# Patient Record
Sex: Female | Born: 1955 | Race: White | Hispanic: No | Marital: Single
Health system: Southern US, Academic
[De-identification: ages and names within clinical notes are randomized; demographics above are authoritative.]

## PROBLEM LIST (undated history)

## (undated) ENCOUNTER — Encounter: Attending: Hematology & Oncology | Primary: Hematology & Oncology

## (undated) ENCOUNTER — Ambulatory Visit: Payer: MEDICARE

## (undated) ENCOUNTER — Encounter: Attending: Adult Health | Primary: Adult Health

## (undated) ENCOUNTER — Encounter

## (undated) ENCOUNTER — Ambulatory Visit: Payer: MEDICARE | Attending: Hematology & Oncology | Primary: Hematology & Oncology

## (undated) ENCOUNTER — Telehealth: Attending: Hematology & Oncology | Primary: Hematology & Oncology

## (undated) ENCOUNTER — Ambulatory Visit
Payer: MEDICARE | Attending: Rehabilitative and Restorative Service Providers" | Primary: Rehabilitative and Restorative Service Providers"

## (undated) ENCOUNTER — Telehealth

## (undated) ENCOUNTER — Encounter: Attending: Oncology | Primary: Oncology

## (undated) ENCOUNTER — Ambulatory Visit: Payer: MEDICARE | Attending: Radiation Oncology | Primary: Radiation Oncology

## (undated) ENCOUNTER — Ambulatory Visit: Attending: Oncology | Primary: Oncology

## (undated) ENCOUNTER — Ambulatory Visit

## (undated) ENCOUNTER — Telehealth: Attending: Oncology | Primary: Oncology

## (undated) ENCOUNTER — Ambulatory Visit: Attending: Adult Health | Primary: Adult Health

## (undated) DIAGNOSIS — F32A Depression, unspecified: Secondary | ICD-10-CM

## (undated) DIAGNOSIS — F329 Major depressive disorder, single episode, unspecified: Secondary | ICD-10-CM

## (undated) DIAGNOSIS — I10 Essential (primary) hypertension: Secondary | ICD-10-CM

## (undated) HISTORY — DX: Essential (primary) hypertension: I10

## (undated) HISTORY — DX: Depression, unspecified: F32.A

## (undated) HISTORY — DX: Major depressive disorder, single episode, unspecified: F32.9

---
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
## 2001-04-21 HISTORY — PX: SHOULDER SURGERY: SHX246

## 2004-02-07 ENCOUNTER — Ambulatory Visit: Payer: Self-pay | Admitting: Internal Medicine

## 2006-02-22 ENCOUNTER — Emergency Department: Payer: Self-pay | Admitting: Emergency Medicine

## 2006-03-03 ENCOUNTER — Emergency Department: Payer: Self-pay | Admitting: Emergency Medicine

## 2010-04-02 ENCOUNTER — Ambulatory Visit: Payer: Self-pay

## 2011-12-12 IMAGING — US US RENAL KIDNEY
1 series · 14 of 25 positions shown · non-contrast
Comparison: none

REASON FOR EXAM: hypertension
COMMENTS:

[Series 1: us renal kidney · 0.24mm/px · 14 of 27 slices shown]
[im 1/27]
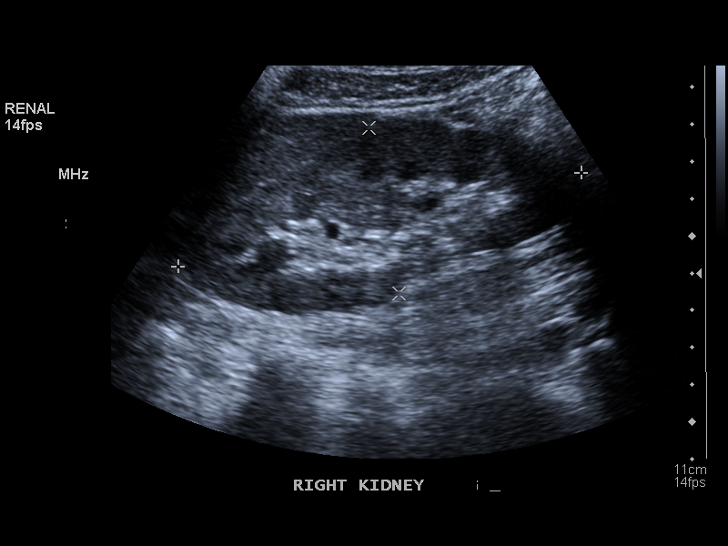
[im 3/27]
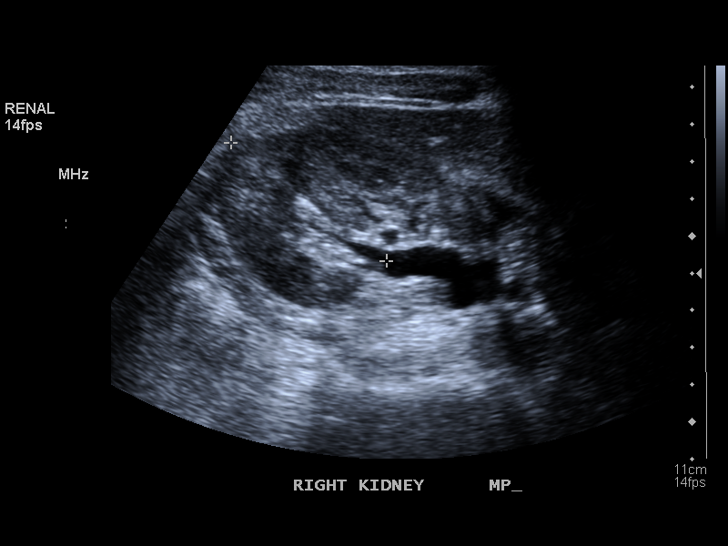
[im 5/27]
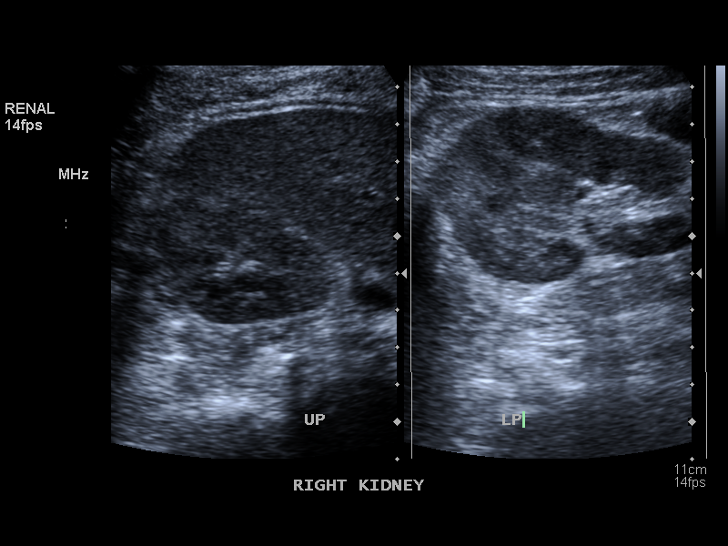
[im 7/27]
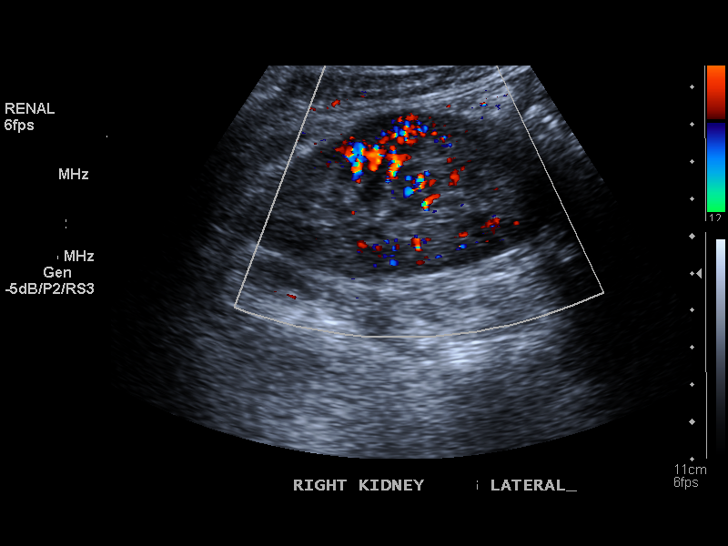
[im 9/27]
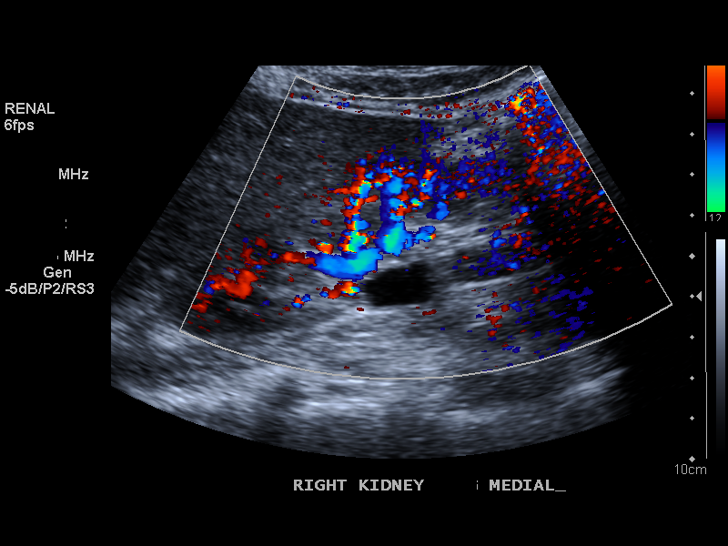
[im 10/27]
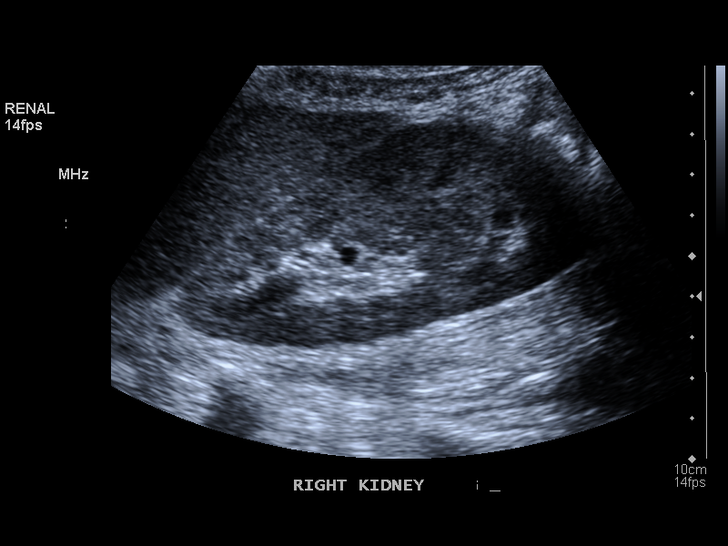
[im 12/27]
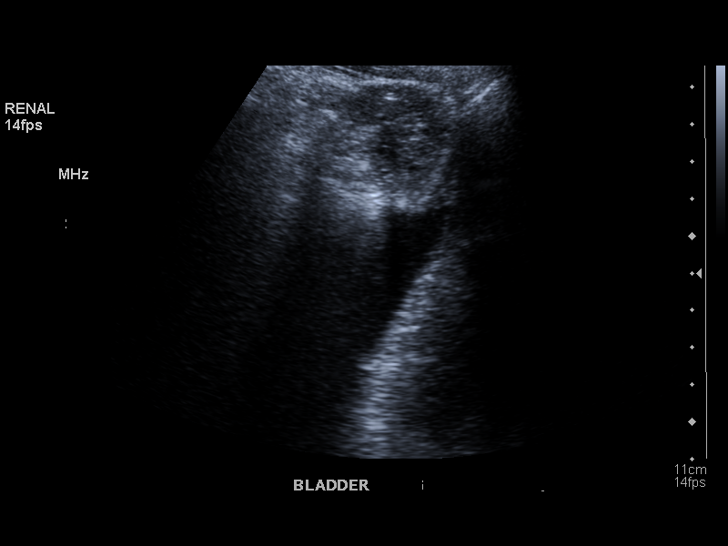
[im 15/27]
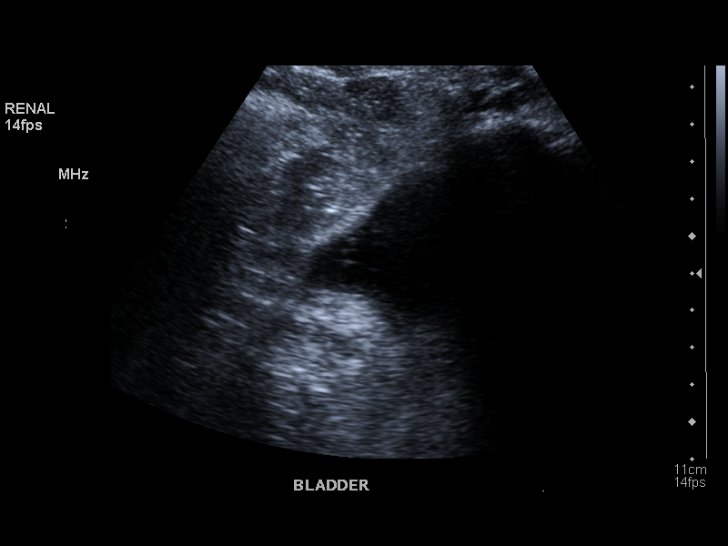
[im 17/27]
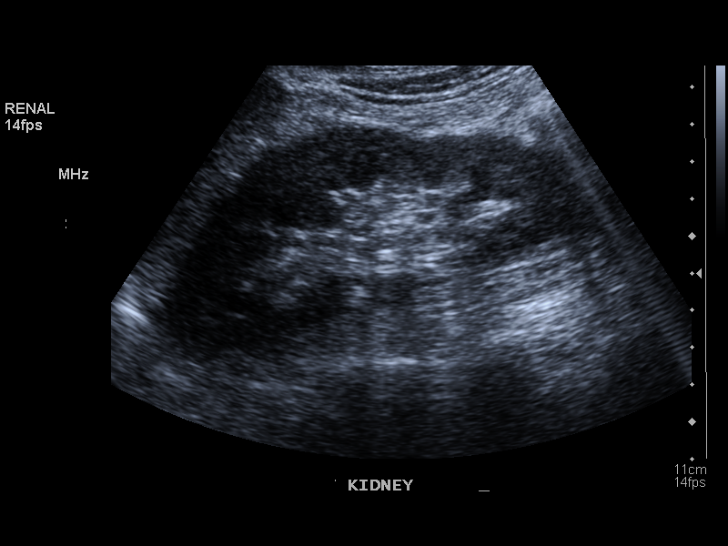
[im 18/27]
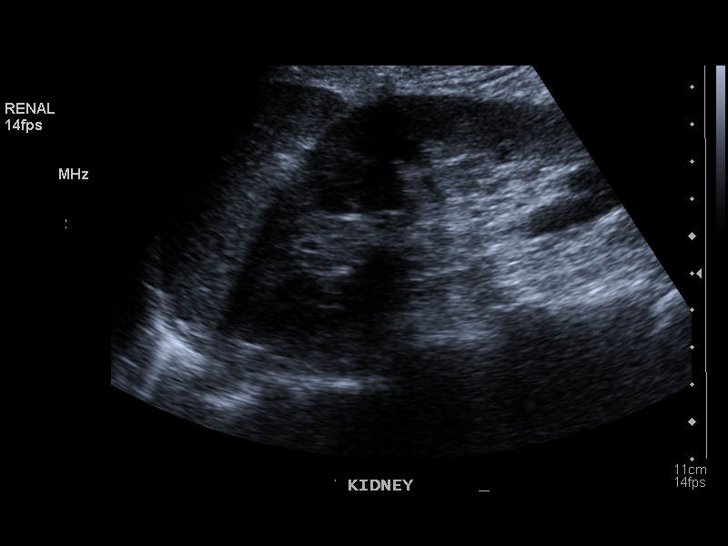
[im 20/27]
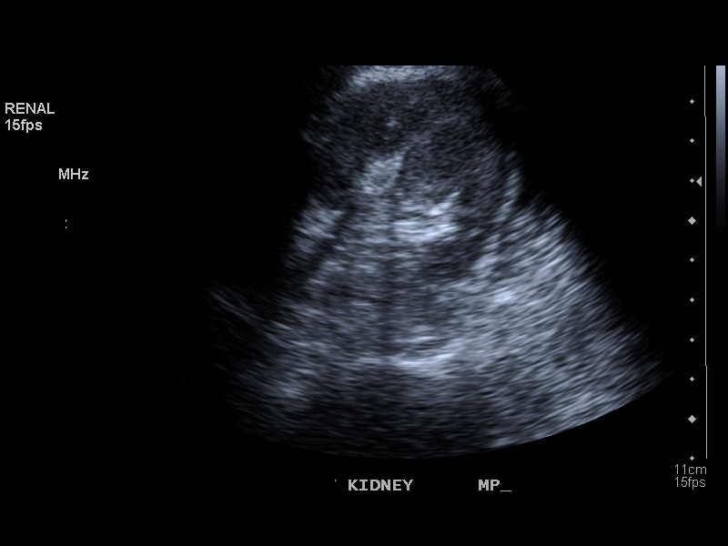
[im 22/27]
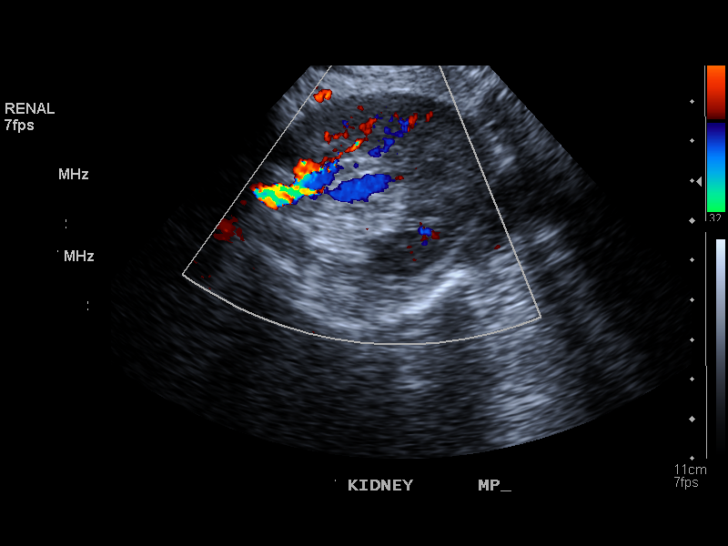
[im 24/27]
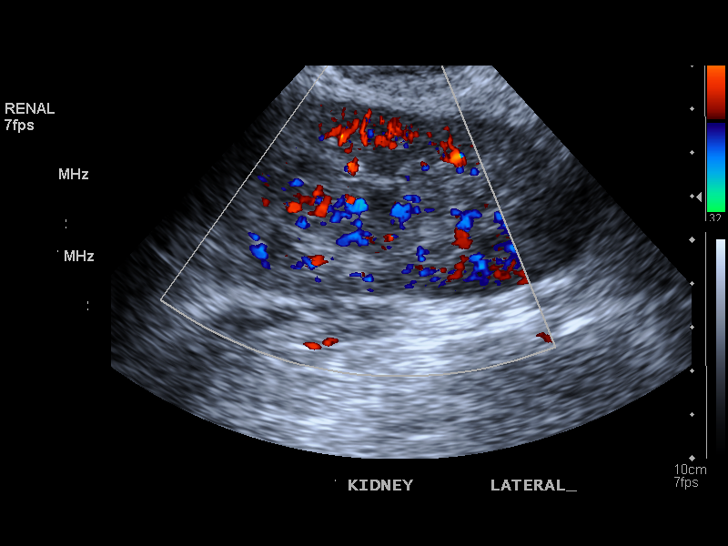
[im 27/27]
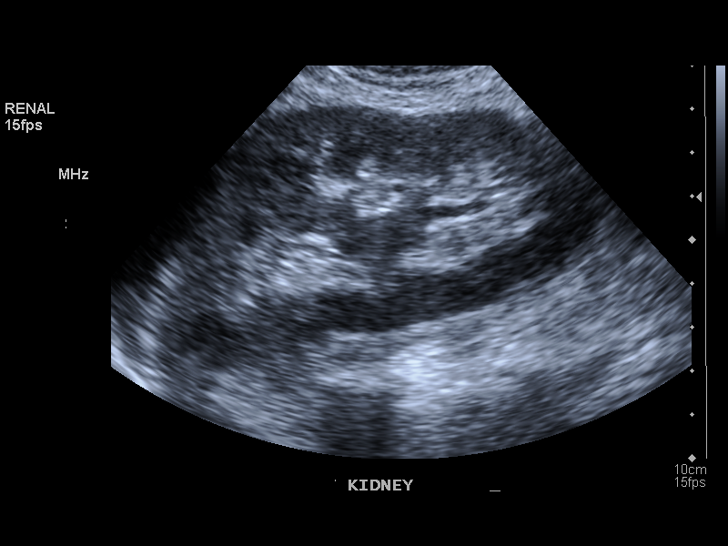

[14 of 25 positions shown; findings below may reference images not displayed]

PROCEDURE:     US  - US KIDNEY  - April 02, 2010  [DATE]

RESULT:     The right kidney measures 11.13 cm x 4.53 cm x 5.26 cm and the
left kidney measures 11.83 cm x 5.06 cm x 4.37 cm. No renal mass lesions are
seen. No renal calcifications are observed. There is no hydronephrosis.
Renal cortical margins bilaterally are smooth. The urinary bladder is nearly
empty since the patient voided prior to the exam. No free fluid is seen in
the pelvis.
IMPRESSION: 1. The kidneys are of symmetrical size.
2. No renal mass lesions, calcifications or hydronephrosis is seen.

## 2013-06-20 ENCOUNTER — Ambulatory Visit: Payer: Self-pay | Admitting: Family Medicine

## 2014-09-25 ENCOUNTER — Other Ambulatory Visit: Payer: Self-pay | Admitting: Family Medicine

## 2014-09-25 MED ORDER — METOPROLOL SUCCINATE ER 100 MG PO TB24: 100.0000 mg | ORAL_TABLET | Freq: Every day | ORAL | Status: AC

## 2014-10-31 ENCOUNTER — Other Ambulatory Visit: Payer: Self-pay | Admitting: Family Medicine

## 2014-11-30 ENCOUNTER — Other Ambulatory Visit: Payer: Self-pay | Admitting: *Deleted

## 2014-11-30 DIAGNOSIS — R000 Tachycardia, unspecified: Secondary | ICD-10-CM | POA: Insufficient documentation

## 2014-11-30 DIAGNOSIS — F329 Major depressive disorder, single episode, unspecified: Secondary | ICD-10-CM | POA: Insufficient documentation

## 2014-11-30 DIAGNOSIS — L659 Nonscarring hair loss, unspecified: Secondary | ICD-10-CM | POA: Insufficient documentation

## 2014-11-30 DIAGNOSIS — Z87898 Personal history of other specified conditions: Secondary | ICD-10-CM | POA: Insufficient documentation

## 2014-11-30 DIAGNOSIS — J45909 Unspecified asthma, uncomplicated: Secondary | ICD-10-CM | POA: Insufficient documentation

## 2014-11-30 DIAGNOSIS — M67449 Ganglion, unspecified hand: Secondary | ICD-10-CM | POA: Insufficient documentation

## 2014-11-30 DIAGNOSIS — R634 Abnormal weight loss: Secondary | ICD-10-CM | POA: Insufficient documentation

## 2014-11-30 DIAGNOSIS — J019 Acute sinusitis, unspecified: Secondary | ICD-10-CM | POA: Insufficient documentation

## 2014-11-30 DIAGNOSIS — R0989 Other specified symptoms and signs involving the circulatory and respiratory systems: Secondary | ICD-10-CM | POA: Insufficient documentation

## 2014-11-30 DIAGNOSIS — F32A Depression, unspecified: Secondary | ICD-10-CM | POA: Insufficient documentation

## 2014-11-30 DIAGNOSIS — Z23 Encounter for immunization: Secondary | ICD-10-CM | POA: Insufficient documentation

## 2014-11-30 DIAGNOSIS — Z719 Counseling, unspecified: Secondary | ICD-10-CM | POA: Insufficient documentation

## 2014-11-30 DIAGNOSIS — Z789 Other specified health status: Secondary | ICD-10-CM | POA: Insufficient documentation

## 2014-11-30 DIAGNOSIS — I10 Essential (primary) hypertension: Secondary | ICD-10-CM | POA: Insufficient documentation

## 2014-11-30 DIAGNOSIS — F17200 Nicotine dependence, unspecified, uncomplicated: Secondary | ICD-10-CM | POA: Insufficient documentation

## 2014-12-01 ENCOUNTER — Ambulatory Visit (INDEPENDENT_AMBULATORY_CARE_PROVIDER_SITE_OTHER): Payer: BLUE CROSS/BLUE SHIELD | Admitting: Family Medicine

## 2014-12-01 ENCOUNTER — Encounter: Payer: Self-pay | Admitting: Family Medicine

## 2014-12-01 VITALS — BP 100/50 | HR 62 | Temp 97.9°F | Resp 16 | Ht 63.0 in | Wt 89.4 lb

## 2014-12-01 DIAGNOSIS — I10 Essential (primary) hypertension: Secondary | ICD-10-CM | POA: Diagnosis not present

## 2014-12-01 DIAGNOSIS — M1990 Unspecified osteoarthritis, unspecified site: Secondary | ICD-10-CM | POA: Diagnosis not present

## 2014-12-01 DIAGNOSIS — R634 Abnormal weight loss: Secondary | ICD-10-CM | POA: Diagnosis not present

## 2014-12-01 DIAGNOSIS — R0989 Other specified symptoms and signs involving the circulatory and respiratory systems: Secondary | ICD-10-CM | POA: Insufficient documentation

## 2014-12-01 DIAGNOSIS — R000 Tachycardia, unspecified: Secondary | ICD-10-CM | POA: Diagnosis not present

## 2014-12-01 MED ORDER — AMLODIPINE BESYLATE 5 MG PO TABS: 5.0000 mg | ORAL_TABLET | Freq: Every day | ORAL | Status: AC

## 2014-12-01 MED ORDER — MELOXICAM 7.5 MG PO TABS: 7.5000 mg | ORAL_TABLET | Freq: Every day | ORAL | Status: AC

## 2014-12-01 NOTE — Patient Instructions (Addendum)
Discussed supplementing diet with [employer] Breakfast 2-3 times a day. Take 1/2 of 10 mg. Amlodipine daily until bottle empty. Cont. Other meds

## 2014-12-01 NOTE — Progress Notes (Signed)
Name: Veronica Fowler   MRN: [id number]    DOB: 19-Dec-1955   Date:12/01/2014       Progress Note  Subjective  Chief Complaint  Chief Complaint  Patient presents with  . Follow-up    pt complains rt hand has been hurting x 6 mos.   . Hand Pain  . Hypertension    HPI  Here for f/u of HBP and underweight.  C/o Pain in R. Hand (base of thumb).  Having problems eating enough calories.  Losing weight again. No problem-specific assessment & plan notes found for this encounter.   Past Medical History  Diagnosis Date  . Depression   . Hypertension     Social History  Substance Use Topics  . Smoking status: Current Every Day Smoker -- 1.00 packs/day for 42 years    Types: Cigarettes  . Smokeless tobacco: Never Used  . Alcohol Use: 0.0 oz/week    0 Standard drinks or equivalent per week     Current outpatient prescriptions:  .  losartan (COZAAR) 25 MG tablet, take 1 tablet by mouth once daily, Disp: 30 tablet, Rfl: 1 .  metoprolol succinate (TOPROL-XL) 100 MG 24 hr tablet, Take 1 tablet (100 mg total) by mouth daily. Take with or immediately following a meal., Disp: 90 tablet, Rfl: 3 .  Prenatal Vit-Fe Fumarate-FA (PRENATAL VITAMINS) 28-0.8 MG TABS, Take 1 tablet by mouth daily., Disp: , Rfl:  .  amLODipine (NORVASC) 5 MG tablet, Take 1 tablet (5 mg total) by mouth daily., Disp: 90 tablet, Rfl: 3 .  meloxicam (MOBIC) 7.5 MG tablet, Take 1 tablet (7.5 mg total) by mouth daily. Take with food., Disp: 30 tablet, Rfl: 3  Allergies  Allergen Reactions  . Penicillins Hives    Other reaction(s): Itching    Review of Systems  Constitutional: Positive for weight loss. Negative for fever, chills and malaise/fatigue.  HENT: Negative for hearing loss.   Eyes: Negative for blurred vision and double vision.  Respiratory: Negative for cough, sputum production, shortness of breath and wheezing.   Cardiovascular: Negative for chest pain, palpitations, orthopnea and leg swelling.   Gastrointestinal: Negative for heartburn, nausea, vomiting, abdominal pain, diarrhea and blood in stool.  Genitourinary: Negative for dysuria, urgency and frequency.  Musculoskeletal: Negative for joint pain (R. thumb base).  Skin: Negative for rash.  Neurological: Negative for dizziness, tingling, sensory change, focal weakness, weakness and headaches.  Psychiatric/Behavioral: Negative for depression. The patient is not nervous/anxious.       Objective  Filed Vitals:   12/01/14 0912 12/01/14 0956  BP: 106/59 100/50  Pulse: 62   Temp: 97.9 F (36.6 C)   TempSrc: Oral   Resp: 16   Height: 5\' 3"  (1.6 m)   Weight: 89 lb 6.4 oz (40.552 kg)      Physical Exam  Constitutional: She is oriented to person, place, and time. No distress.  cachetic  HENT:  Head: Normocephalic and atraumatic.  Eyes: Conjunctivae and EOM are normal. Pupils are equal, round, and reactive to light. No scleral icterus.  Neck: Normal range of motion. Neck supple. Carotid bruit is not present. No thyromegaly present.  Cardiovascular: Normal rate, regular rhythm, normal heart sounds and intact distal pulses.  Exam reveals no gallop and no friction rub.   No murmur heard. Pulmonary/Chest: Effort normal and breath sounds normal. No respiratory distress. She has no wheezes. She has no rales.  Abdominal: Soft. Bowel sounds are normal. She exhibits abdominal bruit. She exhibits no distension and  no mass. There is no tenderness.  Musculoskeletal: Normal range of motion. She exhibits no edema.  Lymphadenopathy:    She has no cervical adenopathy.  Neurological: She is alert and oriented to person, place, and time. No cranial nerve deficit. Coordination normal.  Skin: Skin is warm and dry.  Vitals reviewed.     No results found for this or any previous visit (from the past 2160 hour(s)).   Assessment & Plan  1. Essential hypertension  - amLODipine (NORVASC) 5 MG tablet; Take 1 tablet (5 mg total) by mouth  daily.  Dispense: 90 tablet; Refill: 3-decreased from 10 mg/d. 2. Decreased body weight  - Comprehensive Metabolic Panel (CMET) - CBC with Differential - TSH  3. Fast heart beat   4. Arthritis  - meloxicam (MOBIC) 7.5 MG tablet; Take 1 tablet (7.5 mg total) by mouth daily. Take with food.  Dispense: 30 tablet; Refill: 3  5. Abdominal bruit  - Lipid Profile

## 2014-12-07 LAB — CBC WITH DIFFERENTIAL/PLATELET
Basophils Absolute: 0.1 x10E3/uL (ref 0.0–0.2)
Basos: 1 %
EOS (ABSOLUTE): 0.3 x10E3/uL (ref 0.0–0.4)
Eos: 3 %
Hematocrit: 39.9 % (ref 34.0–46.6)
Hemoglobin: 13.8 g/dL (ref 11.1–15.9)
Immature Grans (Abs): 0 x10E3/uL (ref 0.0–0.1)
Immature Granulocytes: 0 %
Lymphocytes Absolute: 2.8 x10E3/uL (ref 0.7–3.1)
Lymphs: 32 %
MCH: 33.7 pg — ABNORMAL HIGH (ref 26.6–33.0)
MCHC: 34.6 g/dL (ref 31.5–35.7)
MCV: 98 fL — ABNORMAL HIGH (ref 79–97)
Monocytes Absolute: 1.2 x10E3/uL — ABNORMAL HIGH (ref 0.1–0.9)
Monocytes: 13 %
Neutrophils Absolute: 4.5 x10E3/uL (ref 1.4–7.0)
Neutrophils: 51 %
Platelets: 310 x10E3/uL (ref 150–379)
RBC: 4.09 x10E6/uL (ref 3.77–5.28)
RDW: 12 % — ABNORMAL LOW (ref 12.3–15.4)
WBC: 8.8 x10E3/uL (ref 3.4–10.8)

## 2014-12-08 ENCOUNTER — Telehealth: Payer: Self-pay | Admitting: Family Medicine

## 2014-12-08 LAB — COMPREHENSIVE METABOLIC PANEL
ALT: 7 IU/L (ref 0–32)
AST: 19 IU/L (ref 0–40)
Albumin/Globulin Ratio: 1.9 (ref 1.1–2.5)
Albumin: 4.4 g/dL (ref 3.6–4.8)
Alkaline Phosphatase: 81 IU/L (ref 39–117)
BUN/Creatinine Ratio: 31 — ABNORMAL HIGH (ref 11–26)
BUN: 19 mg/dL (ref 8–27)
Bilirubin Total: 0.3 mg/dL (ref 0.0–1.2)
CALCIUM: 9.3 mg/dL (ref 8.7–10.3)
CO2: 21 mmol/L (ref 18–29)
CREATININE: 0.62 mg/dL (ref 0.57–1.00)
Chloride: 100 mmol/L (ref 97–108)
GFR calc Af Amer: 113 mL/min/{1.73_m2} (ref >59)
GFR, EST NON AFRICAN AMERICAN: 98 mL/min/{1.73_m2} (ref >59)
Globulin, Total: 2.3 g/dL (ref 1.5–4.5)
Glucose: 80 mg/dL (ref 65–99)
Potassium: 4.8 mmol/L (ref 3.5–5.2)
Sodium: 139 mmol/L (ref 134–144)
Total Protein: 6.7 g/dL (ref 6.0–8.5)

## 2014-12-08 LAB — LIPID PANEL
CHOL/HDL RATIO: 2.3 ratio (ref 0.0–4.4)
CHOLESTEROL TOTAL: 167 mg/dL (ref 100–199)
HDL: 73 mg/dL (ref >39)
LDL Calculated: 81 mg/dL (ref 0–99)
Triglycerides: 65 mg/dL (ref 0–149)
VLDL Cholesterol Cal: 13 mg/dL (ref 5–40)

## 2014-12-08 LAB — TSH: TSH: 1.18 u[IU]/mL (ref 0.450–4.500)

## 2014-12-08 NOTE — Telephone Encounter (Signed)
Called [facility] and added on B12 and folate due to macrocytosis on CBC under Dx code d75.89.

## 2014-12-09 LAB — B12 AND FOLATE PANEL: Vitamin B-12: 619 pg/mL (ref 211–946)

## 2014-12-12 NOTE — Progress Notes (Signed)
Mailed.JH  

## 2014-12-13 LAB — SPECIMEN STATUS REPORT

## 2014-12-13 NOTE — Progress Notes (Signed)
Mailed

## 2015-01-13 ENCOUNTER — Other Ambulatory Visit: Payer: Self-pay | Admitting: Family Medicine

## 2015-02-16 ENCOUNTER — Other Ambulatory Visit: Payer: Self-pay | Admitting: Family Medicine

## 2015-03-01 IMAGING — US ULTRASOUND RETROPERITONEAL COMPLETE
1 series · 14 of 25 positions shown · non-contrast
Comparison: None.

CLINICAL DATA: Abdominal bruit

EXAM:
RETROPERITONEAL ULTRASOUND COMPLETE
TECHNIQUE: Ultrasound examination of the abdominal aorta was performed to
evaluate for abdominal aortic aneurysm. The common iliac arteries,
IVC, and kidneys were also evaluated.

[Series 1: ultrasound retroperitoneal complete · 0.16mm/px · 14 of 53 slices shown]
[im 1/53]
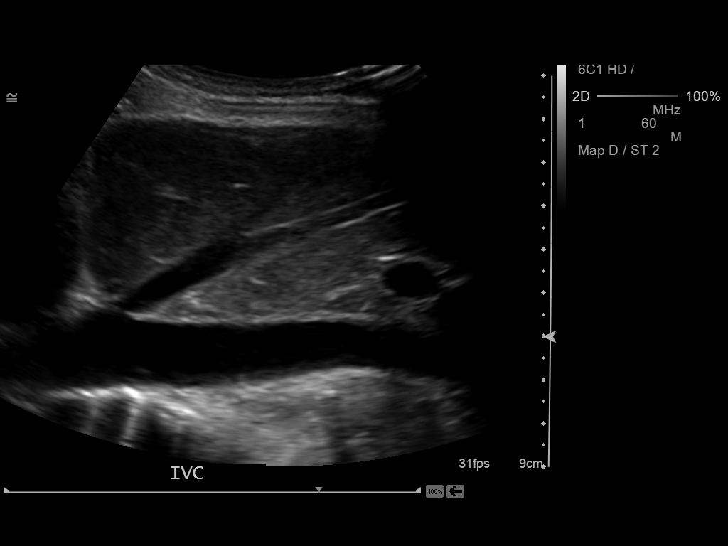
[im 5/53]
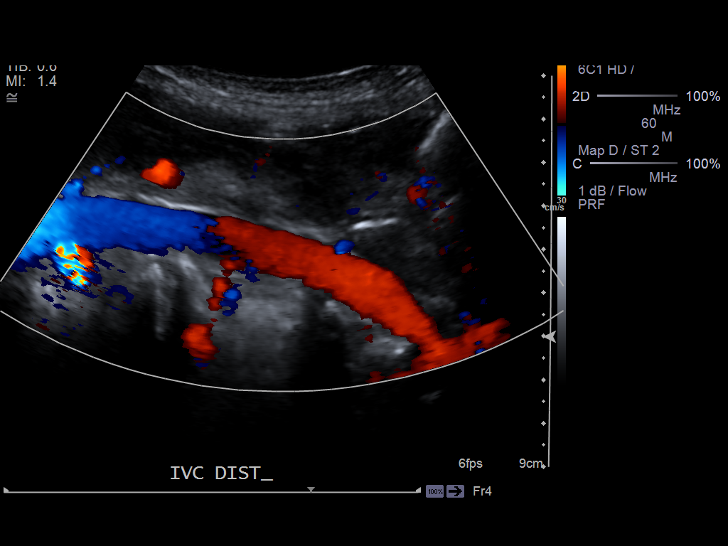
[im 9/53]
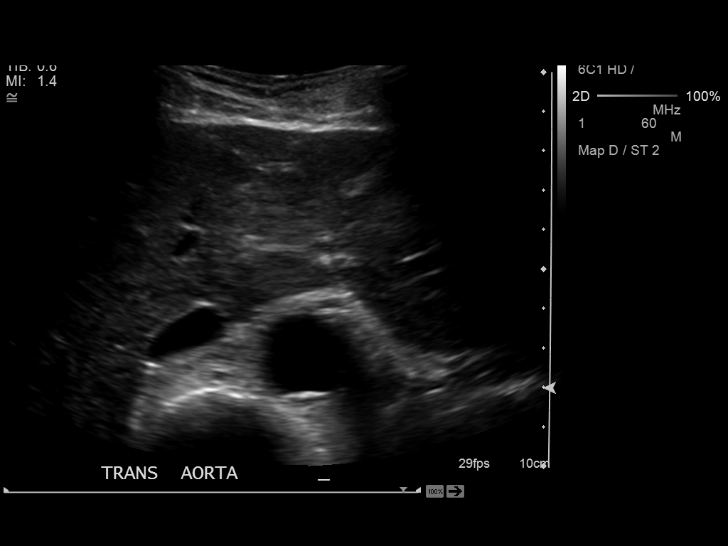
[im 14/53]
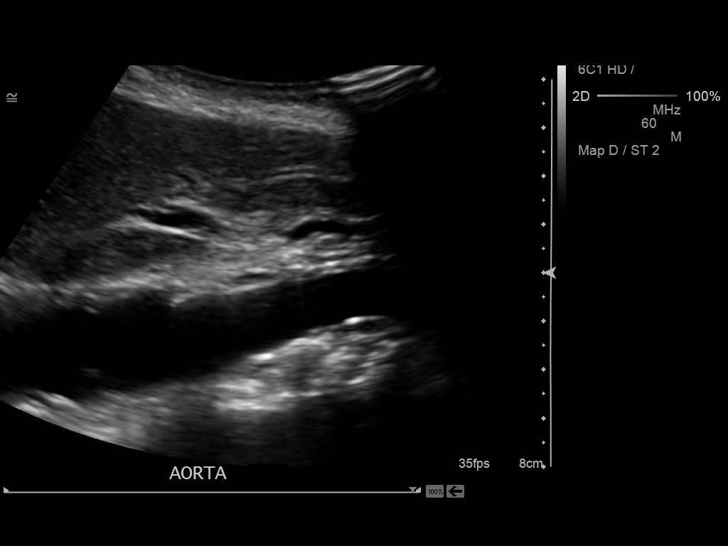
[im 18/53]
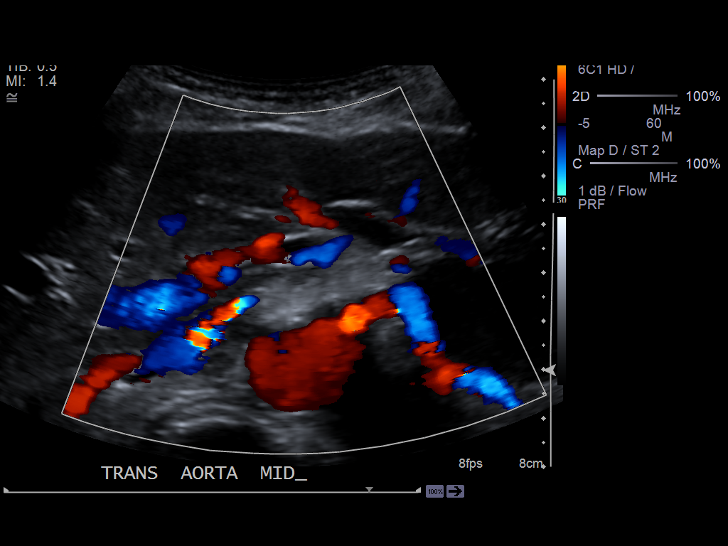
[im 20/53]
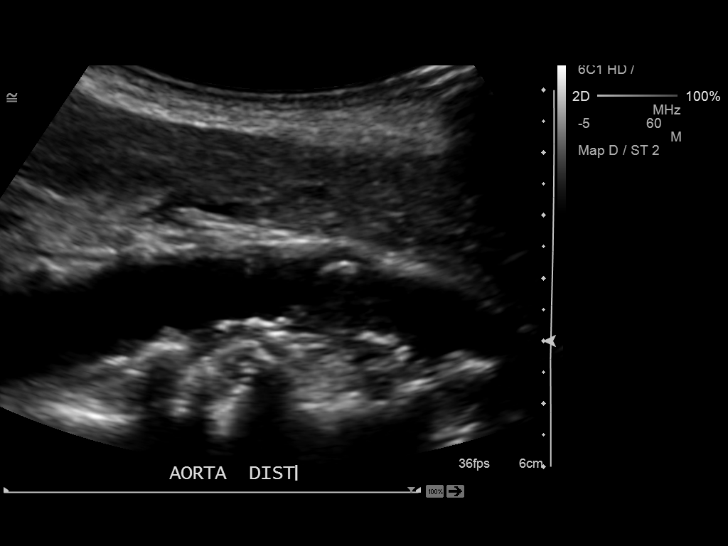
[im 24/53]
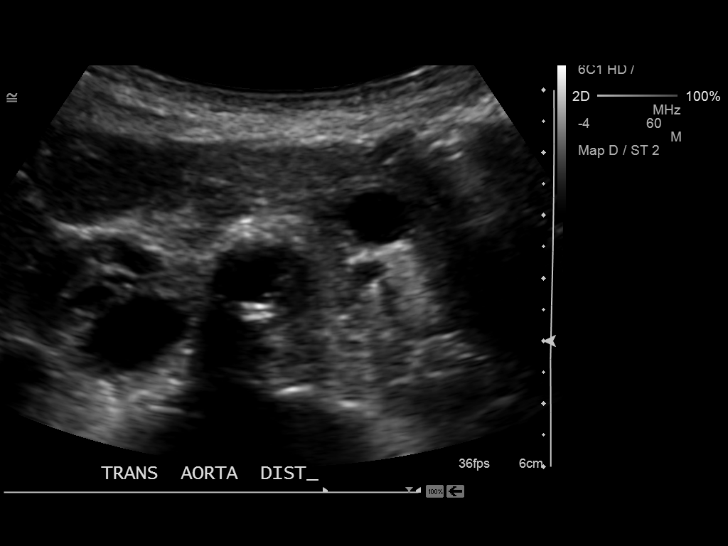
[im 29/53]
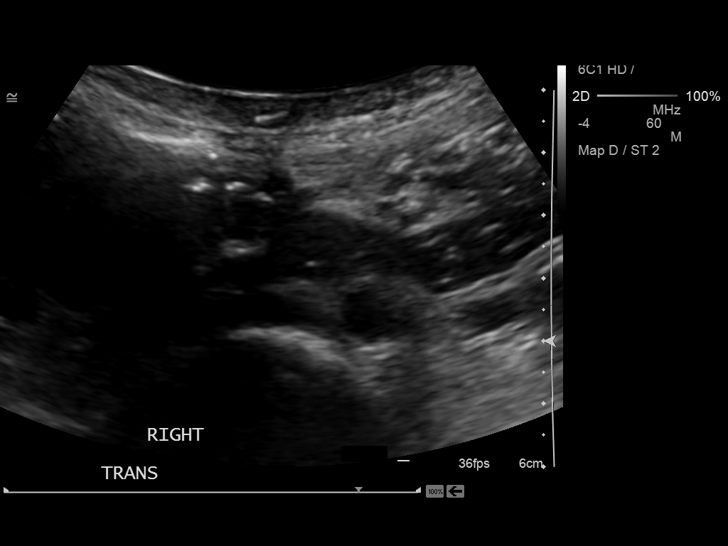
[im 33/53]
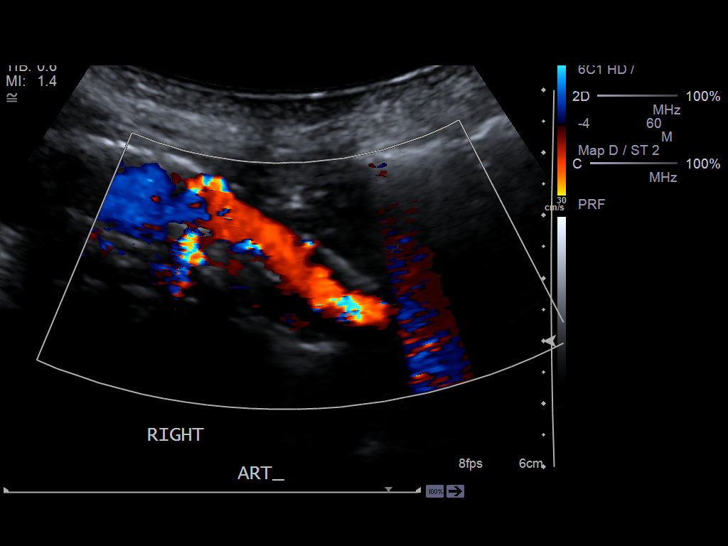
[im 35/53]
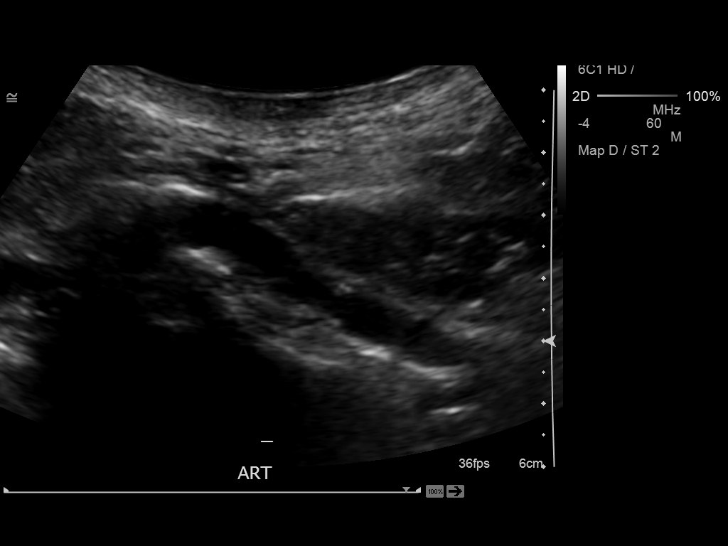
[im 40/53]
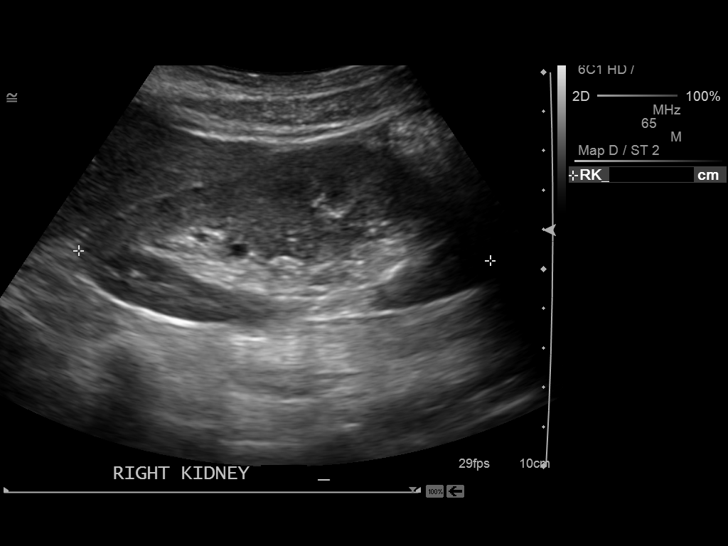
[im 44/53]
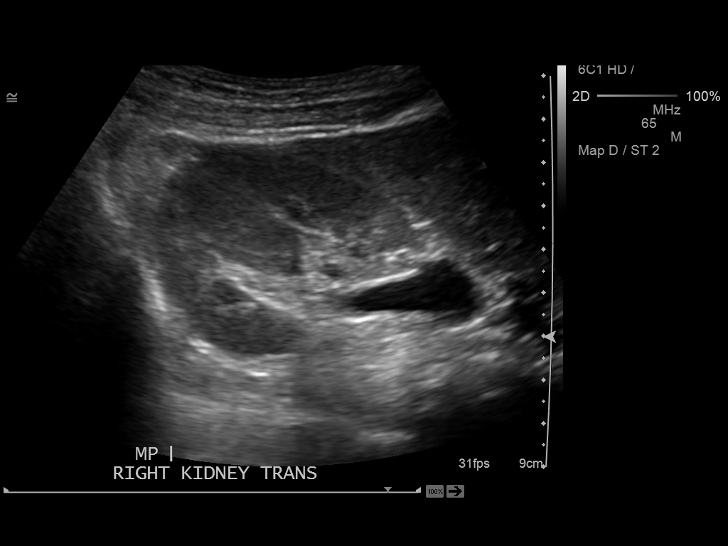
[im 48/53]
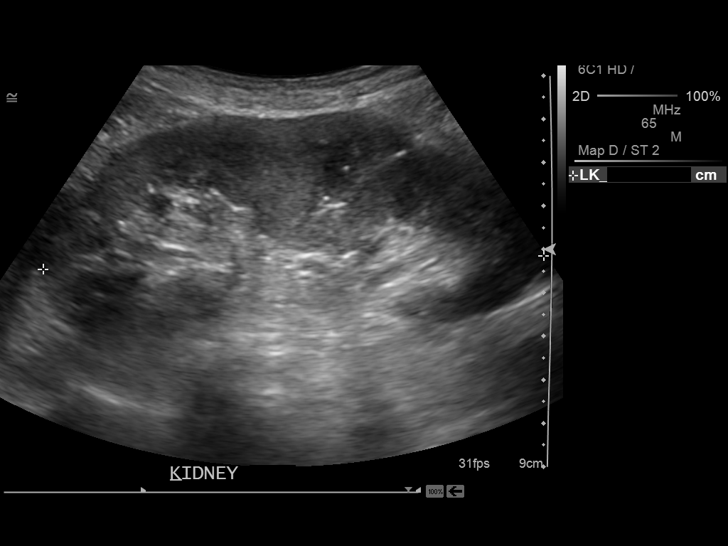
[im 53/53]
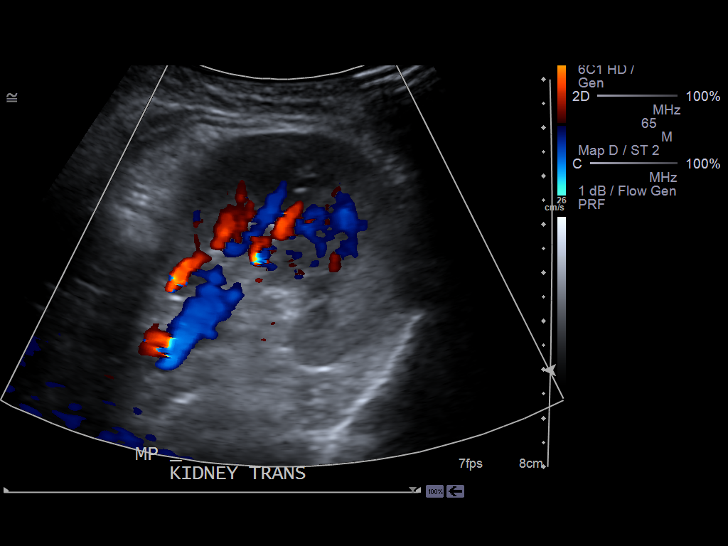

[14 of 25 positions shown; findings below may reference images not displayed]

FINDINGS: Abdominal Aorta

No aneurysm identified.  There is abdominal aortic atherosclerosis.

Maximum AP

Diameter:  2.4 cm

Maximum TRV

Diameter: 2.4 cm

Right Common Iliac Artery

No aneurysm identified.

Left Common Iliac Artery

No aneurysm identified.

IVC

No abnormality visualized.

Right Kidney

Length: 10.4 cm Echogenicity within normal limits. No mass or
hydronephrosis visualized.

Left Kidney

Length: 11.5 cm Echogenicity within normal limits. No mass or
hydronephrosis visualized.
IMPRESSION: No sonographic evidence of an abdominal aortic aneurysm.

## 2015-07-24 ENCOUNTER — Other Ambulatory Visit: Payer: Self-pay | Admitting: Family Medicine

## 2015-07-24 MED ORDER — LOSARTAN POTASSIUM 25 MG PO TABS: 25.0000 mg | ORAL_TABLET | Freq: Every day | ORAL | Status: AC

## 2015-10-09 ENCOUNTER — Telehealth: Payer: Self-pay | Admitting: *Deleted

## 2015-10-09 NOTE — Telephone Encounter (Signed)
Received refill request Losartan and Metoprolol. Last ov 11/2014 Patient needs appt for bp f/u before refills can be processed. Left detailed message on vmail to contact office to schedule.

## 2016-02-01 ENCOUNTER — Other Ambulatory Visit: Payer: Self-pay | Admitting: Otolaryngology

## 2016-02-01 DIAGNOSIS — C029 Malignant neoplasm of tongue, unspecified: Secondary | ICD-10-CM

## 2016-02-07 ENCOUNTER — Ambulatory Visit
Admission: RE | Admit: 2016-02-07 | Discharge: 2016-02-07 | Disposition: A | Discharge status: Home / Self Care | Payer: BLUE CROSS/BLUE SHIELD | Source: Ambulatory Visit | Attending: Otolaryngology | Admitting: Otolaryngology

## 2016-02-07 DIAGNOSIS — C770 Secondary and unspecified malignant neoplasm of lymph nodes of head, face and neck: Secondary | ICD-10-CM | POA: Insufficient documentation

## 2016-02-07 DIAGNOSIS — C029 Malignant neoplasm of tongue, unspecified: Secondary | ICD-10-CM | POA: Diagnosis not present

## 2016-02-07 LAB — POCT I-STAT CREATININE: CREATININE: 0.6 mg/dL (ref 0.44–1.00)

## 2016-02-07 MED ORDER — IOPAMIDOL (ISOVUE-300) INJECTION 61%
75.0000 mL | Freq: Once | INTRAVENOUS | Status: AC | PRN
  Administered 2016-02-07: 75 mL via INTRAVENOUS

## 2016-12-08 ENCOUNTER — Ambulatory Visit
Admission: RE | Admit: 2016-12-08 | Discharge: 2016-12-19 | Disposition: A | Discharge status: Home / Self Care | Payer: MEDICAID | Attending: Radiation Oncology | Admitting: Radiation Oncology

## 2016-12-08 ENCOUNTER — Ambulatory Visit: Admission: RE | Admit: 2016-12-08 | Discharge: 2016-12-19 | Disposition: A | Discharge status: Home / Self Care

## 2016-12-08 DIAGNOSIS — C029 Malignant neoplasm of tongue, unspecified: Principal | ICD-10-CM

## 2016-12-09 DIAGNOSIS — Z08 Encounter for follow-up examination after completed treatment for malignant neoplasm: Principal | ICD-10-CM

## 2017-02-25 ENCOUNTER — Other Ambulatory Visit: Payer: Self-pay | Admitting: Family

## 2017-02-25 DIAGNOSIS — Z1231 Encounter for screening mammogram for malignant neoplasm of breast: Secondary | ICD-10-CM

## 2017-03-18 ENCOUNTER — Ambulatory Visit: Admission: RE | Admit: 2017-03-18 | Discharge: 2017-03-18

## 2017-03-18 DIAGNOSIS — C029 Malignant neoplasm of tongue, unspecified: Principal | ICD-10-CM

## 2017-04-06 ENCOUNTER — Ambulatory Visit
Admission: RE | Admit: 2017-04-06 | Discharge: 2017-05-05 | Disposition: A | Discharge status: Home / Self Care | Payer: MEDICAID | Attending: Rehabilitative and Restorative Service Providers" | Admitting: Rehabilitative and Restorative Service Providers"

## 2017-04-06 DIAGNOSIS — C029 Malignant neoplasm of tongue, unspecified: Principal | ICD-10-CM

## 2017-04-06 DIAGNOSIS — I890 Lymphedema, not elsewhere classified: Secondary | ICD-10-CM

## 2017-05-25 ENCOUNTER — Ambulatory Visit: Admit: 2017-05-25 | Discharge: 2017-05-26 | Payer: MEDICARE

## 2017-05-25 ENCOUNTER — Ambulatory Visit
Admit: 2017-05-25 | Discharge: 2017-06-18 | Payer: MEDICARE | Attending: Radiation Oncology | Primary: Radiation Oncology

## 2017-05-25 DIAGNOSIS — C029 Malignant neoplasm of tongue, unspecified: Principal | ICD-10-CM

## 2017-05-25 DIAGNOSIS — R160 Hepatomegaly, not elsewhere classified: Secondary | ICD-10-CM

## 2017-06-01 ENCOUNTER — Ambulatory Visit
Admit: 2017-06-01 | Discharge: 2017-06-04 | Payer: MEDICARE | Attending: Rehabilitative and Restorative Service Providers" | Primary: Rehabilitative and Restorative Service Providers"

## 2017-06-01 DIAGNOSIS — I890 Lymphedema, not elsewhere classified: Principal | ICD-10-CM

## 2017-06-08 ENCOUNTER — Ambulatory Visit
Admit: 2017-06-08 | Discharge: 2017-07-07 | Payer: MEDICARE | Attending: Rehabilitative and Restorative Service Providers" | Primary: Rehabilitative and Restorative Service Providers"

## 2017-06-08 DIAGNOSIS — I890 Lymphedema, not elsewhere classified: Principal | ICD-10-CM

## 2017-06-09 ENCOUNTER — Ambulatory Visit: Admit: 2017-06-09 | Discharge: 2017-06-09 | Payer: MEDICARE

## 2017-06-09 DIAGNOSIS — R160 Hepatomegaly, not elsewhere classified: Principal | ICD-10-CM

## 2017-06-15 ENCOUNTER — Ambulatory Visit
Admit: 2017-06-15 | Discharge: 2017-07-04 | Payer: MEDICARE | Attending: Rehabilitative and Restorative Service Providers" | Primary: Rehabilitative and Restorative Service Providers"

## 2017-06-15 DIAGNOSIS — I890 Lymphedema, not elsewhere classified: Principal | ICD-10-CM

## 2017-06-16 ENCOUNTER — Ambulatory Visit
Admit: 2017-06-16 | Discharge: 2017-06-17 | Payer: MEDICARE | Attending: Hematology & Oncology | Primary: Hematology & Oncology

## 2017-06-16 DIAGNOSIS — R634 Abnormal weight loss: Secondary | ICD-10-CM

## 2017-06-16 DIAGNOSIS — M1990 Unspecified osteoarthritis, unspecified site: Secondary | ICD-10-CM

## 2017-06-16 DIAGNOSIS — C029 Malignant neoplasm of tongue, unspecified: Secondary | ICD-10-CM

## 2017-06-16 DIAGNOSIS — C760 Malignant neoplasm of head, face and neck: Principal | ICD-10-CM

## 2017-06-16 DIAGNOSIS — J189 Pneumonia, unspecified organism: Secondary | ICD-10-CM

## 2017-06-16 DIAGNOSIS — I10 Essential (primary) hypertension: Secondary | ICD-10-CM

## 2017-06-29 ENCOUNTER — Ambulatory Visit
Admit: 2017-06-29 | Discharge: 2017-06-30 | Payer: MEDICARE | Attending: Hematology & Oncology | Primary: Hematology & Oncology

## 2017-06-29 DIAGNOSIS — J189 Pneumonia, unspecified organism: Secondary | ICD-10-CM

## 2017-06-29 DIAGNOSIS — Z87898 Personal history of other specified conditions: Secondary | ICD-10-CM

## 2017-06-29 DIAGNOSIS — I10 Essential (primary) hypertension: Secondary | ICD-10-CM

## 2017-06-29 DIAGNOSIS — M67449 Ganglion, unspecified hand: Secondary | ICD-10-CM

## 2017-06-29 DIAGNOSIS — R634 Abnormal weight loss: Secondary | ICD-10-CM

## 2017-06-29 DIAGNOSIS — M1990 Unspecified osteoarthritis, unspecified site: Secondary | ICD-10-CM

## 2017-06-29 DIAGNOSIS — Z719 Counseling, unspecified: Secondary | ICD-10-CM

## 2017-06-29 DIAGNOSIS — R0989 Other specified symptoms and signs involving the circulatory and respiratory systems: Secondary | ICD-10-CM

## 2017-06-29 DIAGNOSIS — J0100 Acute maxillary sinusitis, unspecified: Secondary | ICD-10-CM

## 2017-06-29 DIAGNOSIS — F325 Major depressive disorder, single episode, in full remission: Secondary | ICD-10-CM

## 2017-06-29 DIAGNOSIS — R000 Tachycardia, unspecified: Secondary | ICD-10-CM

## 2017-06-29 DIAGNOSIS — C029 Malignant neoplasm of tongue, unspecified: Principal | ICD-10-CM

## 2017-06-29 DIAGNOSIS — Z23 Encounter for immunization: Secondary | ICD-10-CM

## 2017-06-29 DIAGNOSIS — J4520 Mild intermittent asthma, uncomplicated: Secondary | ICD-10-CM

## 2017-06-29 DIAGNOSIS — Z789 Other specified health status: Secondary | ICD-10-CM

## 2017-06-29 DIAGNOSIS — L659 Nonscarring hair loss, unspecified: Secondary | ICD-10-CM

## 2017-06-29 MED ORDER — ONDANSETRON HCL 8 MG TABLET
ORAL_TABLET | Freq: Three times a day (TID) | ORAL | 2 refills | 0.00000 days | Status: CP | PRN
Start: 2017-06-29 — End: 2017-07-06

## 2017-06-29 MED ORDER — PROCHLORPERAZINE MALEATE 10 MG TABLET
ORAL_TABLET | Freq: Four times a day (QID) | ORAL | 2 refills | 0.00000 days | Status: CP | PRN
Start: 2017-06-29 — End: 2017-07-06

## 2017-06-29 MED ORDER — ONDANSETRON HCL 8 MG TABLET: 8 mg | tablet | Freq: Three times a day (TID) | 2 refills | 0 days | Status: AC

## 2017-06-29 MED ORDER — PROCHLORPERAZINE MALEATE 10 MG TABLET: 10 mg | tablet | 2 refills | 0 days

## 2017-07-01 ENCOUNTER — Ambulatory Visit: Admit: 2017-07-01 | Discharge: 2017-07-02 | Payer: MEDICARE

## 2017-07-01 DIAGNOSIS — C029 Malignant neoplasm of tongue, unspecified: Principal | ICD-10-CM

## 2017-07-06 ENCOUNTER — Ambulatory Visit
Admit: 2017-07-06 | Discharge: 2017-08-03 | Payer: MEDICARE | Attending: Rehabilitative and Restorative Service Providers" | Primary: Rehabilitative and Restorative Service Providers"

## 2017-07-06 ENCOUNTER — Ambulatory Visit: Admit: 2017-07-06 | Discharge: 2017-07-06 | Payer: MEDICARE

## 2017-07-06 DIAGNOSIS — C029 Malignant neoplasm of tongue, unspecified: Principal | ICD-10-CM

## 2017-07-06 DIAGNOSIS — I890 Lymphedema, not elsewhere classified: Principal | ICD-10-CM

## 2017-07-06 MED ORDER — ONDANSETRON HCL 8 MG TABLET
ORAL_TABLET | Freq: Three times a day (TID) | ORAL | 2 refills | 0 days | Status: CP | PRN
Start: 2017-07-06 — End: ?

## 2017-07-06 MED ORDER — PROCHLORPERAZINE MALEATE 10 MG TABLET
ORAL_TABLET | Freq: Four times a day (QID) | ORAL | 2 refills | 0.00000 days | Status: CP | PRN
Start: 2017-07-06 — End: ?

## 2017-07-13 ENCOUNTER — Ambulatory Visit: Admit: 2017-07-13 | Discharge: 2017-07-14 | Payer: MEDICARE

## 2017-07-13 DIAGNOSIS — C029 Malignant neoplasm of tongue, unspecified: Principal | ICD-10-CM

## 2017-07-27 ENCOUNTER — Other Ambulatory Visit: Admit: 2017-07-27 | Discharge: 2017-07-27 | Payer: MEDICARE

## 2017-07-27 ENCOUNTER — Ambulatory Visit: Admit: 2017-07-27 | Discharge: 2017-07-27 | Payer: MEDICARE

## 2017-07-27 ENCOUNTER — Ambulatory Visit
Admit: 2017-07-27 | Discharge: 2017-07-27 | Payer: MEDICARE | Attending: Hematology & Oncology | Primary: Hematology & Oncology

## 2017-07-27 DIAGNOSIS — R634 Abnormal weight loss: Secondary | ICD-10-CM

## 2017-07-27 DIAGNOSIS — Z23 Encounter for immunization: Secondary | ICD-10-CM

## 2017-07-27 DIAGNOSIS — C029 Malignant neoplasm of tongue, unspecified: Principal | ICD-10-CM

## 2017-07-27 DIAGNOSIS — I10 Essential (primary) hypertension: Secondary | ICD-10-CM

## 2017-07-27 DIAGNOSIS — J4520 Mild intermittent asthma, uncomplicated: Secondary | ICD-10-CM

## 2017-07-27 DIAGNOSIS — R0989 Other specified symptoms and signs involving the circulatory and respiratory systems: Secondary | ICD-10-CM

## 2017-07-27 DIAGNOSIS — M67449 Ganglion, unspecified hand: Secondary | ICD-10-CM

## 2017-07-27 DIAGNOSIS — J189 Pneumonia, unspecified organism: Secondary | ICD-10-CM

## 2017-07-27 DIAGNOSIS — L659 Nonscarring hair loss, unspecified: Secondary | ICD-10-CM

## 2017-07-27 DIAGNOSIS — J0100 Acute maxillary sinusitis, unspecified: Secondary | ICD-10-CM

## 2017-07-27 DIAGNOSIS — M1990 Unspecified osteoarthritis, unspecified site: Secondary | ICD-10-CM

## 2017-07-27 DIAGNOSIS — Z719 Counseling, unspecified: Secondary | ICD-10-CM

## 2017-07-27 DIAGNOSIS — F325 Major depressive disorder, single episode, in full remission: Secondary | ICD-10-CM

## 2017-07-27 DIAGNOSIS — Z87898 Personal history of other specified conditions: Secondary | ICD-10-CM

## 2017-07-27 DIAGNOSIS — R000 Tachycardia, unspecified: Secondary | ICD-10-CM

## 2017-07-27 DIAGNOSIS — Z789 Other specified health status: Secondary | ICD-10-CM

## 2017-07-27 MED ORDER — CLOTRIMAZOLE 10 MG TROCHE
ORAL_TABLET | Freq: Three times a day (TID) | ORAL | 0 refills | 0.00000 days | Status: CP
Start: 2017-07-27 — End: 2017-08-17

## 2017-07-27 MED ORDER — CLOTRIMAZOLE 10 MG TROCHE: each | 0 refills | 0 days

## 2017-07-27 MED FILL — CLOTRIMAZOLE/10MG/LOZ: CLOTRIMAZOLE/10MG/LOZ | 14 days supply | Qty: 42 | Fill #0

## 2017-08-03 ENCOUNTER — Ambulatory Visit: Admit: 2017-08-03 | Discharge: 2017-08-04 | Payer: MEDICARE

## 2017-08-03 DIAGNOSIS — C029 Malignant neoplasm of tongue, unspecified: Principal | ICD-10-CM

## 2017-08-15 ENCOUNTER — Ambulatory Visit: Admit: 2017-08-15 | Discharge: 2017-08-16 | Payer: MEDICARE

## 2017-08-15 DIAGNOSIS — C029 Malignant neoplasm of tongue, unspecified: Principal | ICD-10-CM

## 2017-08-17 ENCOUNTER — Ambulatory Visit
Admit: 2017-08-17 | Discharge: 2017-08-17 | Payer: MEDICARE | Attending: Hematology & Oncology | Primary: Hematology & Oncology

## 2017-08-17 ENCOUNTER — Ambulatory Visit: Admit: 2017-08-17 | Discharge: 2017-08-17 | Payer: MEDICARE

## 2017-08-17 ENCOUNTER — Other Ambulatory Visit: Admit: 2017-08-17 | Discharge: 2017-08-17 | Payer: MEDICARE

## 2017-08-17 DIAGNOSIS — R634 Abnormal weight loss: Secondary | ICD-10-CM

## 2017-08-17 DIAGNOSIS — J189 Pneumonia, unspecified organism: Secondary | ICD-10-CM

## 2017-08-17 DIAGNOSIS — C029 Malignant neoplasm of tongue, unspecified: Principal | ICD-10-CM

## 2017-08-17 DIAGNOSIS — J0100 Acute maxillary sinusitis, unspecified: Secondary | ICD-10-CM

## 2017-08-17 DIAGNOSIS — I10 Essential (primary) hypertension: Secondary | ICD-10-CM

## 2017-08-17 DIAGNOSIS — L299 Pruritus, unspecified: Secondary | ICD-10-CM

## 2017-08-17 DIAGNOSIS — J4520 Mild intermittent asthma, uncomplicated: Secondary | ICD-10-CM

## 2017-08-17 MED ORDER — CLOTRIMAZOLE 10 MG TROCHE
ORAL_TABLET | Freq: Three times a day (TID) | ORAL | 0 refills | 0 days | Status: CP
Start: 2017-08-17 — End: 2017-08-31

## 2017-08-17 MED ORDER — HYDROXYZINE HCL 25 MG TABLET
ORAL_TABLET | Freq: Four times a day (QID) | ORAL | 0 refills | 0.00000 days | Status: CP | PRN
Start: 2017-08-17 — End: 2018-01-18

## 2017-08-24 ENCOUNTER — Ambulatory Visit: Admit: 2017-08-24 | Discharge: 2017-08-25 | Payer: MEDICARE

## 2017-08-24 DIAGNOSIS — C029 Malignant neoplasm of tongue, unspecified: Principal | ICD-10-CM

## 2017-09-07 ENCOUNTER — Ambulatory Visit: Admit: 2017-09-07 | Discharge: 2017-09-07 | Payer: MEDICARE

## 2017-09-07 ENCOUNTER — Other Ambulatory Visit: Admit: 2017-09-07 | Discharge: 2017-09-07 | Payer: MEDICARE

## 2017-09-07 ENCOUNTER — Ambulatory Visit
Admit: 2017-09-07 | Discharge: 2017-09-07 | Payer: MEDICARE | Attending: Hematology & Oncology | Primary: Hematology & Oncology

## 2017-09-07 DIAGNOSIS — C029 Malignant neoplasm of tongue, unspecified: Principal | ICD-10-CM

## 2017-09-07 DIAGNOSIS — J0100 Acute maxillary sinusitis, unspecified: Secondary | ICD-10-CM

## 2017-09-07 DIAGNOSIS — R634 Abnormal weight loss: Secondary | ICD-10-CM

## 2017-09-07 DIAGNOSIS — F325 Major depressive disorder, single episode, in full remission: Secondary | ICD-10-CM

## 2017-09-07 DIAGNOSIS — I10 Essential (primary) hypertension: Secondary | ICD-10-CM

## 2017-09-07 DIAGNOSIS — L659 Nonscarring hair loss, unspecified: Secondary | ICD-10-CM

## 2017-09-07 DIAGNOSIS — M1990 Unspecified osteoarthritis, unspecified site: Secondary | ICD-10-CM

## 2017-09-07 DIAGNOSIS — J189 Pneumonia, unspecified organism: Secondary | ICD-10-CM

## 2017-09-07 MED ORDER — NYSTATIN 100,000 UNIT/ML ORAL SUSPENSION
Freq: Four times a day (QID) | ORAL | 3 refills | 0 days | Status: CP
Start: 2017-09-07 — End: 2017-09-17

## 2017-09-07 MED ORDER — DULOXETINE 30 MG CAPSULE,DELAYED RELEASE
ORAL_CAPSULE | Freq: Every day | ORAL | 2 refills | 0 days | Status: CP
Start: 2017-09-07 — End: 2017-12-07

## 2017-09-07 MED FILL — DULOXETINE HCL/30MG/CPEP: DULOXETINE HCL/30MG/CPEP | 34 days supply | Qty: 54 | Fill #0

## 2017-09-07 MED FILL — NYSTATIN/100000U/SUS: NYSTATIN/100000U/SUS | 7 days supply | Qty: 140 | Fill #0

## 2017-09-15 ENCOUNTER — Other Ambulatory Visit: Admit: 2017-09-15 | Discharge: 2017-09-16 | Payer: MEDICARE

## 2017-09-15 ENCOUNTER — Ambulatory Visit: Admit: 2017-09-15 | Discharge: 2017-09-16 | Payer: MEDICARE

## 2017-09-15 DIAGNOSIS — C029 Malignant neoplasm of tongue, unspecified: Principal | ICD-10-CM

## 2017-09-15 DIAGNOSIS — H6123 Impacted cerumen, bilateral: Secondary | ICD-10-CM

## 2017-09-26 ENCOUNTER — Ambulatory Visit: Admit: 2017-09-26 | Discharge: 2017-09-27 | Payer: MEDICARE

## 2017-09-26 DIAGNOSIS — C029 Malignant neoplasm of tongue, unspecified: Principal | ICD-10-CM

## 2017-09-28 ENCOUNTER — Ambulatory Visit
Admit: 2017-09-28 | Discharge: 2017-09-29 | Payer: MEDICARE | Attending: Hematology & Oncology | Primary: Hematology & Oncology

## 2017-09-28 ENCOUNTER — Ambulatory Visit: Admit: 2017-09-28 | Discharge: 2017-09-29 | Payer: MEDICARE

## 2017-09-28 ENCOUNTER — Other Ambulatory Visit: Admit: 2017-09-28 | Discharge: 2017-09-29 | Payer: MEDICARE

## 2017-09-28 DIAGNOSIS — R000 Tachycardia, unspecified: Secondary | ICD-10-CM

## 2017-09-28 DIAGNOSIS — C029 Malignant neoplasm of tongue, unspecified: Principal | ICD-10-CM

## 2017-09-28 DIAGNOSIS — J189 Pneumonia, unspecified organism: Secondary | ICD-10-CM

## 2017-09-28 DIAGNOSIS — M1990 Unspecified osteoarthritis, unspecified site: Secondary | ICD-10-CM

## 2017-09-28 DIAGNOSIS — Z719 Counseling, unspecified: Secondary | ICD-10-CM

## 2017-09-28 DIAGNOSIS — R634 Abnormal weight loss: Secondary | ICD-10-CM

## 2017-09-28 DIAGNOSIS — L659 Nonscarring hair loss, unspecified: Secondary | ICD-10-CM

## 2017-09-28 DIAGNOSIS — R0989 Other specified symptoms and signs involving the circulatory and respiratory systems: Secondary | ICD-10-CM

## 2017-09-28 DIAGNOSIS — Z23 Encounter for immunization: Secondary | ICD-10-CM

## 2017-09-28 DIAGNOSIS — I10 Essential (primary) hypertension: Secondary | ICD-10-CM

## 2017-09-28 DIAGNOSIS — Z789 Other specified health status: Secondary | ICD-10-CM

## 2017-09-28 DIAGNOSIS — Z87898 Personal history of other specified conditions: Secondary | ICD-10-CM

## 2017-09-28 DIAGNOSIS — M67449 Ganglion, unspecified hand: Secondary | ICD-10-CM

## 2017-09-28 DIAGNOSIS — J0100 Acute maxillary sinusitis, unspecified: Secondary | ICD-10-CM

## 2017-09-28 DIAGNOSIS — F325 Major depressive disorder, single episode, in full remission: Secondary | ICD-10-CM

## 2017-09-28 DIAGNOSIS — J4520 Mild intermittent asthma, uncomplicated: Secondary | ICD-10-CM

## 2017-10-05 ENCOUNTER — Ambulatory Visit: Admit: 2017-10-05 | Discharge: 2017-10-05 | Payer: MEDICARE

## 2017-10-05 DIAGNOSIS — C029 Malignant neoplasm of tongue, unspecified: Principal | ICD-10-CM

## 2017-10-18 IMAGING — CT CT NECK W/ CM
2 of 3 series · 7 of 14 positions shown, 8 images · IV contrast (iopamidol)
Comparison: None.

CLINICAL DATA: Malignant neoplasm of the tongue with severe neck
pain for 1 month.

EXAM:
CT NECK WITH CONTRAST
TECHNIQUE: Multidetector CT imaging of the neck was performed using the
standard protocol following the bolus administration of intravenous
contrast.
CONTRAST:  75mL [id number] IOPAMIDOL (GBHS77-YKK) INJECTION 61%

[Series 2: axial neck · axial · 0.55mm/px · z∈[-349,-207]mm · 3 of 132 slices shown]
[im 33/132  bone]
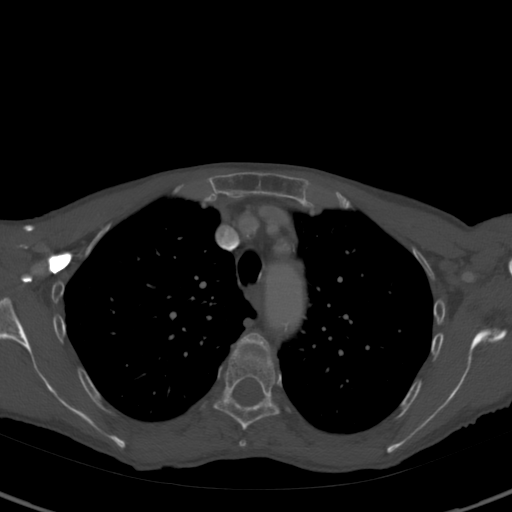
[im 66/132  bone]
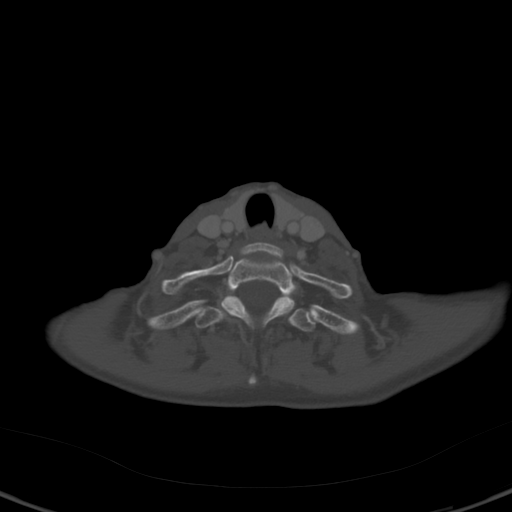
[im 99/132  bone]
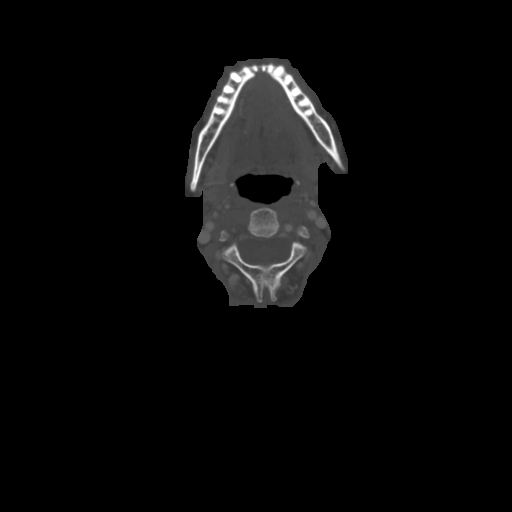

[Series 7: orthogonal ax · axial · 0.43mm/px · z∈[-404,-209]mm · 4 of 142 slices shown, 5 images]
[im 29/142  soft-tissue]
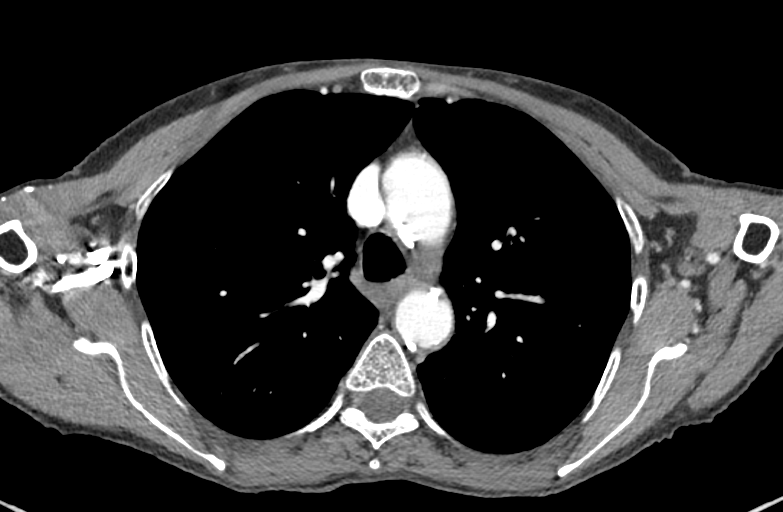
[im 29/142  bone]
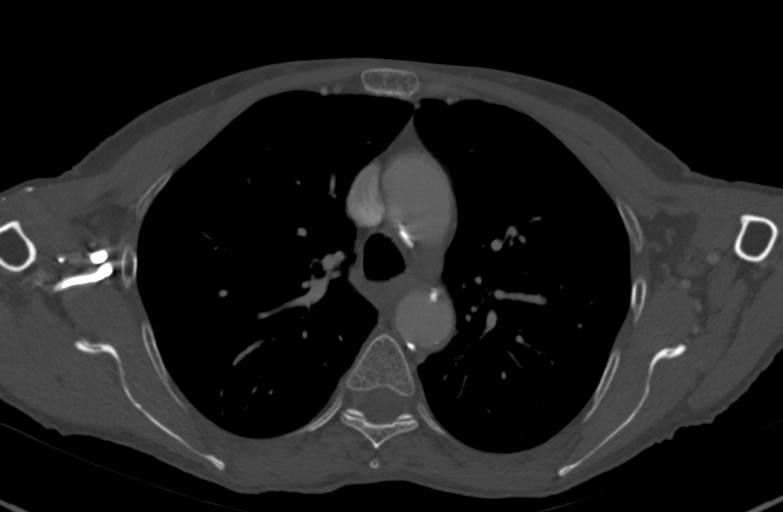
[im 57/142  bone]
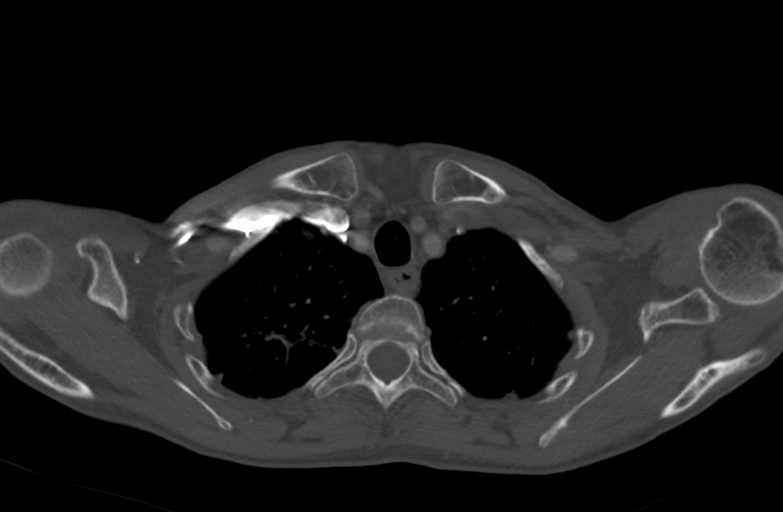
[im 85/142  bone]
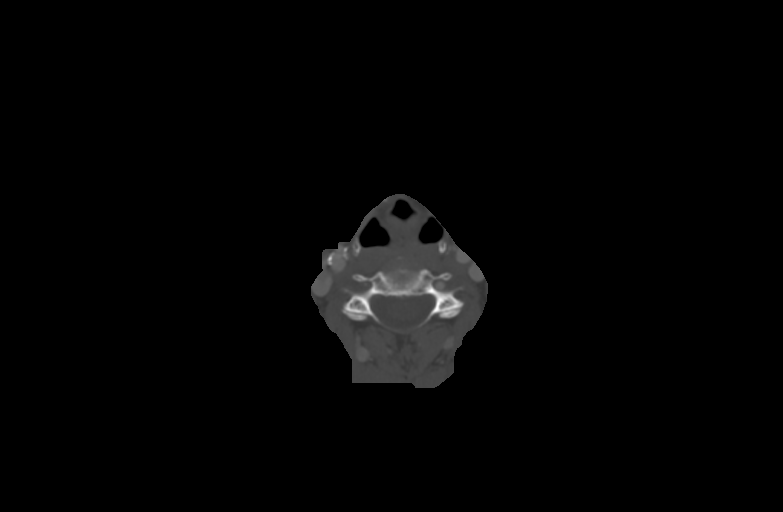
[im 113/142  bone]
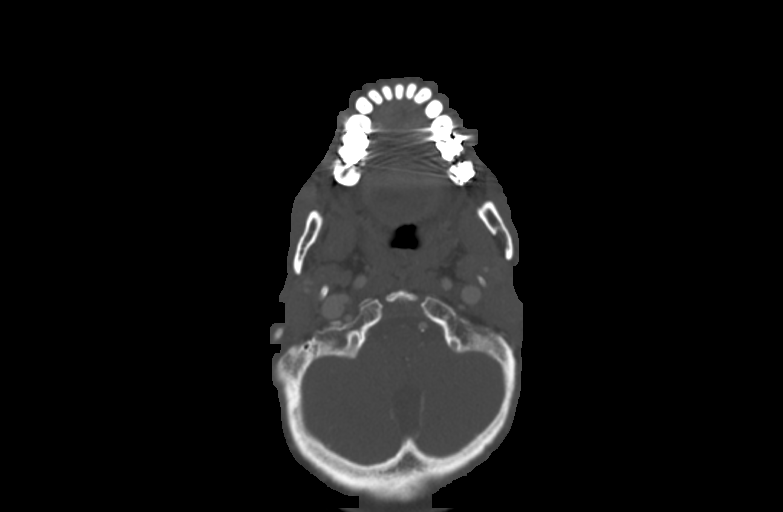

[7 of 14 positions shown; findings below may reference images not displayed]

FINDINGS: Pharynx and larynx: There is an enhancing mass in the oral tongue
which is centered to the left but does cross midline. There is
infiltration into the floor of mouth, which likely accounts for
atrophy and abnormal enhancement of the left submandibular gland.
There is contiguous erosion of the lingual surface of the mandible
involving the central incisor alveoli, a T4 finding. MRI could
further assess degree of muscle infiltration in this patient with
artifact from dental amalgam. Left submandibular and upper cervical
chain cavitary lymphadenopathy consistent with metastatic disease,
measuring up to 14 mm. Right submandibular node is suspicious given
its lobulated margins, measuring up to 9 mm. PET-CT could further
clarify extent of disease.

Salivary glands: Asymmetrically small and hypo enhancing left
submandibular gland, presumably from chronic obstruction.

Thyroid: Negative

Lymph nodes: Malignant nodes described above

Vascular: Atherosclerosis with moderate right ICA stenosis.
Congenitally small right vertebral artery.

Limited intracranial: Negative

Visualized orbits: Essentially not seen

Mastoids and visualized paranasal sinuses: Clear

Skeleton: Lingual symphyseal mandible erosion as above. No
hematogenous osseous metastasis identified.

Upper chest: Negative for nodule. Centrilobular emphysema and
scar-like opacity at the right more than left apex.
IMPRESSION: 1. Findings of oral tongue squamous cell carcinoma that is locally
advanced with mandibular invasion. Ipsilateral and potentially
contralateral metastatic adenopathy.
2. Abnormal left submandibular gland, presumably from chronic ductal
obstruction from floor of mouth tumor infiltration.

## 2017-10-19 ENCOUNTER — Ambulatory Visit: Admit: 2017-10-19 | Discharge: 2017-10-19 | Payer: MEDICARE

## 2017-10-19 ENCOUNTER — Ambulatory Visit
Admit: 2017-10-19 | Discharge: 2017-10-19 | Payer: MEDICARE | Attending: Hematology & Oncology | Primary: Hematology & Oncology

## 2017-10-19 ENCOUNTER — Other Ambulatory Visit: Admit: 2017-10-19 | Discharge: 2017-10-19 | Payer: MEDICARE

## 2017-10-19 DIAGNOSIS — F325 Major depressive disorder, single episode, in full remission: Secondary | ICD-10-CM

## 2017-10-19 DIAGNOSIS — R634 Abnormal weight loss: Secondary | ICD-10-CM

## 2017-10-19 DIAGNOSIS — J189 Pneumonia, unspecified organism: Secondary | ICD-10-CM

## 2017-10-19 DIAGNOSIS — J0100 Acute maxillary sinusitis, unspecified: Secondary | ICD-10-CM

## 2017-10-19 DIAGNOSIS — C029 Malignant neoplasm of tongue, unspecified: Principal | ICD-10-CM

## 2017-10-19 DIAGNOSIS — I10 Essential (primary) hypertension: Secondary | ICD-10-CM

## 2017-10-19 DIAGNOSIS — R000 Tachycardia, unspecified: Secondary | ICD-10-CM

## 2017-10-19 DIAGNOSIS — J4520 Mild intermittent asthma, uncomplicated: Secondary | ICD-10-CM

## 2017-10-19 DIAGNOSIS — R0989 Other specified symptoms and signs involving the circulatory and respiratory systems: Secondary | ICD-10-CM

## 2017-10-19 DIAGNOSIS — Z719 Counseling, unspecified: Secondary | ICD-10-CM

## 2017-10-19 DIAGNOSIS — Z23 Encounter for immunization: Secondary | ICD-10-CM

## 2017-10-19 DIAGNOSIS — Z789 Other specified health status: Secondary | ICD-10-CM

## 2017-10-19 DIAGNOSIS — M67449 Ganglion, unspecified hand: Secondary | ICD-10-CM

## 2017-10-19 DIAGNOSIS — L659 Nonscarring hair loss, unspecified: Secondary | ICD-10-CM

## 2017-10-19 DIAGNOSIS — M1990 Unspecified osteoarthritis, unspecified site: Secondary | ICD-10-CM

## 2017-10-19 DIAGNOSIS — Z87898 Personal history of other specified conditions: Secondary | ICD-10-CM

## 2017-10-26 ENCOUNTER — Ambulatory Visit: Admit: 2017-10-26 | Discharge: 2017-10-27 | Payer: MEDICARE

## 2017-10-26 DIAGNOSIS — C029 Malignant neoplasm of tongue, unspecified: Principal | ICD-10-CM

## 2017-11-09 ENCOUNTER — Ambulatory Visit
Admit: 2017-11-09 | Discharge: 2017-11-09 | Payer: MEDICARE | Attending: Hematology & Oncology | Primary: Hematology & Oncology

## 2017-11-09 ENCOUNTER — Ambulatory Visit: Admit: 2017-11-09 | Discharge: 2017-11-09 | Payer: MEDICARE

## 2017-11-09 ENCOUNTER — Other Ambulatory Visit: Admit: 2017-11-09 | Discharge: 2017-11-09 | Payer: MEDICARE

## 2017-11-09 DIAGNOSIS — J4520 Mild intermittent asthma, uncomplicated: Secondary | ICD-10-CM

## 2017-11-09 DIAGNOSIS — R0609 Other forms of dyspnea: Secondary | ICD-10-CM

## 2017-11-09 DIAGNOSIS — C049 Malignant neoplasm of floor of mouth, unspecified: Principal | ICD-10-CM

## 2017-11-09 DIAGNOSIS — C029 Malignant neoplasm of tongue, unspecified: Principal | ICD-10-CM

## 2017-11-09 DIAGNOSIS — I10 Essential (primary) hypertension: Secondary | ICD-10-CM

## 2017-11-09 DIAGNOSIS — J189 Pneumonia, unspecified organism: Secondary | ICD-10-CM

## 2017-11-09 DIAGNOSIS — C329 Malignant neoplasm of larynx, unspecified: Principal | ICD-10-CM

## 2017-11-09 DIAGNOSIS — M1990 Unspecified osteoarthritis, unspecified site: Secondary | ICD-10-CM

## 2017-11-16 ENCOUNTER — Ambulatory Visit: Admit: 2017-11-16 | Discharge: 2017-11-17 | Payer: MEDICARE

## 2017-11-16 DIAGNOSIS — C029 Malignant neoplasm of tongue, unspecified: Principal | ICD-10-CM

## 2017-11-23 ENCOUNTER — Ambulatory Visit: Admit: 2017-11-23 | Discharge: 2017-11-24 | Payer: MEDICARE

## 2017-11-23 DIAGNOSIS — R0609 Other forms of dyspnea: Principal | ICD-10-CM

## 2017-11-23 DIAGNOSIS — C029 Malignant neoplasm of tongue, unspecified: Principal | ICD-10-CM

## 2017-11-30 ENCOUNTER — Ambulatory Visit: Admit: 2017-11-30 | Discharge: 2017-12-01 | Payer: MEDICARE

## 2017-11-30 DIAGNOSIS — C029 Malignant neoplasm of tongue, unspecified: Principal | ICD-10-CM

## 2017-12-07 ENCOUNTER — Ambulatory Visit: Admit: 2017-12-07 | Discharge: 2017-12-08 | Payer: MEDICARE

## 2017-12-07 DIAGNOSIS — C029 Malignant neoplasm of tongue, unspecified: Principal | ICD-10-CM

## 2017-12-07 MED ORDER — DULOXETINE 30 MG CAPSULE,DELAYED RELEASE
ORAL_CAPSULE | Freq: Every day | ORAL | 2 refills | 0.00000 days | Status: CP
Start: 2017-12-07 — End: 2018-01-18

## 2017-12-14 ENCOUNTER — Ambulatory Visit: Admit: 2017-12-14 | Discharge: 2017-12-15 | Payer: MEDICARE

## 2017-12-14 ENCOUNTER — Other Ambulatory Visit: Admit: 2017-12-14 | Discharge: 2017-12-15 | Payer: MEDICARE

## 2017-12-14 ENCOUNTER — Ambulatory Visit
Admit: 2017-12-14 | Discharge: 2017-12-15 | Payer: MEDICARE | Attending: Hematology & Oncology | Primary: Hematology & Oncology

## 2017-12-14 DIAGNOSIS — I10 Essential (primary) hypertension: Secondary | ICD-10-CM

## 2017-12-14 DIAGNOSIS — J189 Pneumonia, unspecified organism: Secondary | ICD-10-CM

## 2017-12-14 DIAGNOSIS — M67449 Ganglion, unspecified hand: Secondary | ICD-10-CM

## 2017-12-14 DIAGNOSIS — Z23 Encounter for immunization: Secondary | ICD-10-CM

## 2017-12-14 DIAGNOSIS — L659 Nonscarring hair loss, unspecified: Secondary | ICD-10-CM

## 2017-12-14 DIAGNOSIS — Z719 Counseling, unspecified: Secondary | ICD-10-CM

## 2017-12-14 DIAGNOSIS — C029 Malignant neoplasm of tongue, unspecified: Principal | ICD-10-CM

## 2017-12-14 DIAGNOSIS — R0989 Other specified symptoms and signs involving the circulatory and respiratory systems: Secondary | ICD-10-CM

## 2017-12-14 DIAGNOSIS — R634 Abnormal weight loss: Secondary | ICD-10-CM

## 2017-12-14 DIAGNOSIS — J4520 Mild intermittent asthma, uncomplicated: Secondary | ICD-10-CM

## 2017-12-14 DIAGNOSIS — J0100 Acute maxillary sinusitis, unspecified: Secondary | ICD-10-CM

## 2017-12-14 DIAGNOSIS — Z87898 Personal history of other specified conditions: Secondary | ICD-10-CM

## 2017-12-14 DIAGNOSIS — R000 Tachycardia, unspecified: Secondary | ICD-10-CM

## 2017-12-14 DIAGNOSIS — M1990 Unspecified osteoarthritis, unspecified site: Secondary | ICD-10-CM

## 2017-12-14 DIAGNOSIS — F325 Major depressive disorder, single episode, in full remission: Secondary | ICD-10-CM

## 2017-12-14 DIAGNOSIS — Z789 Other specified health status: Secondary | ICD-10-CM

## 2017-12-14 DIAGNOSIS — R0609 Other forms of dyspnea: Secondary | ICD-10-CM

## 2017-12-28 ENCOUNTER — Ambulatory Visit: Admit: 2017-12-28 | Discharge: 2017-12-29 | Payer: MEDICARE

## 2017-12-28 DIAGNOSIS — C029 Malignant neoplasm of tongue, unspecified: Principal | ICD-10-CM

## 2018-01-04 ENCOUNTER — Ambulatory Visit: Admit: 2018-01-04 | Discharge: 2018-01-05 | Payer: MEDICARE

## 2018-01-04 DIAGNOSIS — C029 Malignant neoplasm of tongue, unspecified: Principal | ICD-10-CM

## 2018-01-18 ENCOUNTER — Other Ambulatory Visit: Admit: 2018-01-18 | Discharge: 2018-01-18 | Payer: MEDICARE

## 2018-01-18 ENCOUNTER — Ambulatory Visit
Admit: 2018-01-18 | Discharge: 2018-01-18 | Payer: MEDICARE | Attending: Pharmacist Clinician (PhC)/ Clinical Pharmacy Specialist | Primary: Pharmacist Clinician (PhC)/ Clinical Pharmacy Specialist

## 2018-01-18 ENCOUNTER — Ambulatory Visit: Admit: 2018-01-18 | Discharge: 2018-01-18 | Payer: MEDICARE

## 2018-01-18 ENCOUNTER — Ambulatory Visit
Admit: 2018-01-18 | Discharge: 2018-01-18 | Payer: MEDICARE | Attending: Hematology & Oncology | Primary: Hematology & Oncology

## 2018-01-18 DIAGNOSIS — F325 Major depressive disorder, single episode, in full remission: Secondary | ICD-10-CM

## 2018-01-18 DIAGNOSIS — R634 Abnormal weight loss: Secondary | ICD-10-CM

## 2018-01-18 DIAGNOSIS — C029 Malignant neoplasm of tongue, unspecified: Principal | ICD-10-CM

## 2018-01-18 DIAGNOSIS — R0989 Other specified symptoms and signs involving the circulatory and respiratory systems: Secondary | ICD-10-CM

## 2018-01-18 DIAGNOSIS — M67449 Ganglion, unspecified hand: Secondary | ICD-10-CM

## 2018-01-18 DIAGNOSIS — J189 Pneumonia, unspecified organism: Secondary | ICD-10-CM

## 2018-01-18 DIAGNOSIS — J4520 Mild intermittent asthma, uncomplicated: Secondary | ICD-10-CM

## 2018-01-18 DIAGNOSIS — J0100 Acute maxillary sinusitis, unspecified: Secondary | ICD-10-CM

## 2018-01-18 DIAGNOSIS — L659 Nonscarring hair loss, unspecified: Secondary | ICD-10-CM

## 2018-01-18 DIAGNOSIS — M1990 Unspecified osteoarthritis, unspecified site: Secondary | ICD-10-CM

## 2018-01-18 DIAGNOSIS — I10 Essential (primary) hypertension: Secondary | ICD-10-CM

## 2018-01-18 DIAGNOSIS — R000 Tachycardia, unspecified: Secondary | ICD-10-CM

## 2018-01-18 DIAGNOSIS — Z515 Encounter for palliative care: Secondary | ICD-10-CM

## 2018-01-18 DIAGNOSIS — Z789 Other specified health status: Secondary | ICD-10-CM

## 2018-01-18 DIAGNOSIS — Z87898 Personal history of other specified conditions: Secondary | ICD-10-CM

## 2018-01-18 DIAGNOSIS — R0609 Other forms of dyspnea: Secondary | ICD-10-CM

## 2018-01-18 DIAGNOSIS — Z23 Encounter for immunization: Secondary | ICD-10-CM

## 2018-01-18 DIAGNOSIS — Z719 Counseling, unspecified: Secondary | ICD-10-CM

## 2018-01-18 DIAGNOSIS — R110 Nausea: Secondary | ICD-10-CM

## 2018-01-18 MED ORDER — LIDOCAINE HCL 2 % MUCOSAL SOLUTION
OROMUCOSAL | 2 refills | 0 days | Status: CP | PRN
Start: 2018-01-18 — End: 2018-07-12

## 2018-01-29 ENCOUNTER — Ambulatory Visit: Admit: 2018-01-29 | Discharge: 2018-01-30 | Payer: MEDICARE

## 2018-01-29 DIAGNOSIS — C029 Malignant neoplasm of tongue, unspecified: Principal | ICD-10-CM

## 2018-02-08 ENCOUNTER — Other Ambulatory Visit: Admit: 2018-02-08 | Discharge: 2018-02-08 | Payer: MEDICARE

## 2018-02-08 ENCOUNTER — Ambulatory Visit: Admit: 2018-02-08 | Discharge: 2018-02-08 | Payer: MEDICARE

## 2018-02-08 ENCOUNTER — Ambulatory Visit
Admit: 2018-02-08 | Discharge: 2018-02-08 | Payer: MEDICARE | Attending: Hematology & Oncology | Primary: Hematology & Oncology

## 2018-02-08 DIAGNOSIS — C029 Malignant neoplasm of tongue, unspecified: Principal | ICD-10-CM

## 2018-02-15 ENCOUNTER — Ambulatory Visit: Admit: 2018-02-15 | Discharge: 2018-02-16 | Payer: MEDICARE

## 2018-02-15 DIAGNOSIS — C029 Malignant neoplasm of tongue, unspecified: Principal | ICD-10-CM

## 2018-03-01 ENCOUNTER — Ambulatory Visit: Admit: 2018-03-01 | Discharge: 2018-03-02 | Payer: MEDICARE

## 2018-03-01 DIAGNOSIS — C029 Malignant neoplasm of tongue, unspecified: Principal | ICD-10-CM

## 2018-03-08 ENCOUNTER — Ambulatory Visit: Admit: 2018-03-08 | Discharge: 2018-03-09 | Payer: MEDICARE

## 2018-03-08 DIAGNOSIS — C029 Malignant neoplasm of tongue, unspecified: Principal | ICD-10-CM

## 2018-03-15 ENCOUNTER — Ambulatory Visit: Admit: 2018-03-15 | Discharge: 2018-03-16 | Payer: MEDICARE

## 2018-03-15 DIAGNOSIS — C029 Malignant neoplasm of tongue, unspecified: Principal | ICD-10-CM

## 2018-03-22 ENCOUNTER — Other Ambulatory Visit: Admit: 2018-03-22 | Discharge: 2018-03-22 | Payer: MEDICARE

## 2018-03-22 ENCOUNTER — Ambulatory Visit: Admit: 2018-03-22 | Discharge: 2018-03-22 | Payer: MEDICARE | Attending: Adult Health | Primary: Adult Health

## 2018-03-22 ENCOUNTER — Ambulatory Visit: Admit: 2018-03-22 | Discharge: 2018-03-22 | Payer: MEDICARE

## 2018-03-22 DIAGNOSIS — C029 Malignant neoplasm of tongue, unspecified: Principal | ICD-10-CM

## 2018-03-29 ENCOUNTER — Ambulatory Visit: Admit: 2018-03-29 | Discharge: 2018-03-29 | Payer: MEDICARE

## 2018-03-29 ENCOUNTER — Ambulatory Visit: Admit: 2018-03-29 | Discharge: 2018-03-29 | Payer: MEDICARE | Attending: Adult Health | Primary: Adult Health

## 2018-03-29 DIAGNOSIS — C029 Malignant neoplasm of tongue, unspecified: Principal | ICD-10-CM

## 2018-04-19 ENCOUNTER — Other Ambulatory Visit: Admit: 2018-04-19 | Discharge: 2018-04-20 | Payer: MEDICARE

## 2018-04-19 ENCOUNTER — Ambulatory Visit: Admit: 2018-04-19 | Discharge: 2018-04-20 | Payer: MEDICARE | Attending: Adult Health | Primary: Adult Health

## 2018-04-19 ENCOUNTER — Ambulatory Visit: Admit: 2018-04-19 | Discharge: 2018-04-20 | Payer: MEDICARE

## 2018-04-19 DIAGNOSIS — C029 Malignant neoplasm of tongue, unspecified: Principal | ICD-10-CM

## 2018-04-26 ENCOUNTER — Ambulatory Visit: Admit: 2018-04-26 | Discharge: 2018-04-26 | Payer: MEDICARE

## 2018-04-26 DIAGNOSIS — C029 Malignant neoplasm of tongue, unspecified: Principal | ICD-10-CM

## 2018-05-10 ENCOUNTER — Ambulatory Visit
Admit: 2018-05-10 | Discharge: 2018-05-11 | Payer: MEDICARE | Attending: Student in an Organized Health Care Education/Training Program | Primary: Student in an Organized Health Care Education/Training Program

## 2018-05-10 DIAGNOSIS — C029 Malignant neoplasm of tongue, unspecified: Principal | ICD-10-CM

## 2018-05-31 ENCOUNTER — Ambulatory Visit: Admit: 2018-05-31 | Discharge: 2018-06-01 | Payer: MEDICARE | Attending: Adult Health | Primary: Adult Health

## 2018-05-31 ENCOUNTER — Ambulatory Visit: Admit: 2018-05-31 | Discharge: 2018-06-01 | Payer: MEDICARE

## 2018-05-31 ENCOUNTER — Other Ambulatory Visit: Admit: 2018-05-31 | Discharge: 2018-06-01 | Payer: MEDICARE

## 2018-05-31 DIAGNOSIS — C029 Malignant neoplasm of tongue, unspecified: Principal | ICD-10-CM

## 2018-05-31 MED ORDER — DRONABINOL 2.5 MG CAPSULE
ORAL_CAPSULE | Freq: Two times a day (BID) | ORAL | 2 refills | 0.00000 days | Status: CP
Start: 2018-05-31 — End: 2018-06-21

## 2018-05-31 MED ORDER — DRONABINOL 2.5 MG CAPSULE: 3 mg | capsule | Freq: Two times a day (BID) | 2 refills | 0 days | Status: AC

## 2018-06-07 ENCOUNTER — Ambulatory Visit: Admit: 2018-06-07 | Discharge: 2018-06-08 | Payer: MEDICARE

## 2018-06-07 DIAGNOSIS — C029 Malignant neoplasm of tongue, unspecified: Principal | ICD-10-CM

## 2018-06-21 ENCOUNTER — Ambulatory Visit: Admit: 2018-06-21 | Discharge: 2018-06-22 | Payer: MEDICARE

## 2018-06-21 ENCOUNTER — Ambulatory Visit: Admit: 2018-06-21 | Discharge: 2018-06-22 | Payer: MEDICARE | Attending: Adult Health | Primary: Adult Health

## 2018-06-21 ENCOUNTER — Other Ambulatory Visit: Admit: 2018-06-21 | Discharge: 2018-06-22 | Payer: MEDICARE

## 2018-06-21 DIAGNOSIS — D630 Anemia in neoplastic disease: Principal | ICD-10-CM

## 2018-06-21 DIAGNOSIS — M25551 Pain in right hip: Principal | ICD-10-CM

## 2018-06-21 DIAGNOSIS — C787 Secondary malignant neoplasm of liver and intrahepatic bile duct: Principal | ICD-10-CM

## 2018-06-21 DIAGNOSIS — Z79899 Other long term (current) drug therapy: Principal | ICD-10-CM

## 2018-06-21 DIAGNOSIS — Z5112 Encounter for antineoplastic immunotherapy: Principal | ICD-10-CM

## 2018-06-21 DIAGNOSIS — C7802 Secondary malignant neoplasm of left lung: Principal | ICD-10-CM

## 2018-06-21 DIAGNOSIS — Z87891 Personal history of nicotine dependence: Principal | ICD-10-CM

## 2018-06-21 DIAGNOSIS — C029 Malignant neoplasm of tongue, unspecified: Principal | ICD-10-CM

## 2018-06-21 DIAGNOSIS — R5383 Other fatigue: Principal | ICD-10-CM

## 2018-06-21 DIAGNOSIS — C7951 Secondary malignant neoplasm of bone: Principal | ICD-10-CM

## 2018-06-21 DIAGNOSIS — C040 Malignant neoplasm of anterior floor of mouth: Principal | ICD-10-CM

## 2018-06-21 DIAGNOSIS — M5136 Other intervertebral disc degeneration, lumbar region: Principal | ICD-10-CM

## 2018-06-21 DIAGNOSIS — R0600 Dyspnea, unspecified: Principal | ICD-10-CM

## 2018-06-21 DIAGNOSIS — R634 Abnormal weight loss: Principal | ICD-10-CM

## 2018-06-21 DIAGNOSIS — I10 Essential (primary) hypertension: Principal | ICD-10-CM

## 2018-06-21 DIAGNOSIS — C772 Secondary and unspecified malignant neoplasm of intra-abdominal lymph nodes: Principal | ICD-10-CM

## 2018-06-21 MED ORDER — GABAPENTIN 100 MG CAPSULE
ORAL_CAPSULE | Freq: Every evening | ORAL | 2 refills | 0.00000 days | Status: CP
Start: 2018-06-21 — End: 2018-07-14

## 2018-06-21 MED ORDER — DRONABINOL 5 MG CAPSULE
ORAL_CAPSULE | Freq: Two times a day (BID) | ORAL | 1 refills | 0 days | Status: CP
Start: 2018-06-21 — End: 2018-07-21

## 2018-06-28 ENCOUNTER — Ambulatory Visit
Admit: 2018-06-28 | Discharge: 2018-07-20 | Payer: MEDICARE | Attending: Radiation Oncology | Primary: Radiation Oncology

## 2018-06-28 ENCOUNTER — Ambulatory Visit
Admit: 2018-06-28 | Discharge: 2018-06-29 | Payer: MEDICARE | Attending: Hematology & Oncology | Primary: Hematology & Oncology

## 2018-06-28 DIAGNOSIS — R0609 Other forms of dyspnea: Principal | ICD-10-CM

## 2018-06-28 DIAGNOSIS — Z681 Body mass index (BMI) 19 or less, adult: Principal | ICD-10-CM

## 2018-06-28 DIAGNOSIS — I10 Essential (primary) hypertension: Principal | ICD-10-CM

## 2018-06-28 DIAGNOSIS — C029 Malignant neoplasm of tongue, unspecified: Principal | ICD-10-CM

## 2018-06-28 DIAGNOSIS — R634 Abnormal weight loss: Principal | ICD-10-CM

## 2018-06-28 DIAGNOSIS — J4520 Mild intermittent asthma, uncomplicated: Principal | ICD-10-CM

## 2018-06-28 DIAGNOSIS — C069 Malignant neoplasm of mouth, unspecified: Principal | ICD-10-CM

## 2018-06-29 ENCOUNTER — Ambulatory Visit: Admit: 2018-06-29 | Discharge: 2018-06-30 | Payer: MEDICARE

## 2018-06-29 DIAGNOSIS — C029 Malignant neoplasm of tongue, unspecified: Principal | ICD-10-CM

## 2018-07-09 DIAGNOSIS — C029 Malignant neoplasm of tongue, unspecified: Principal | ICD-10-CM

## 2018-07-09 MED ORDER — CAPECITABINE 500 MG TABLET
ORAL_TABLET | Freq: Two times a day (BID) | ORAL | 1 refills | 0 days | Status: CP
Start: 2018-07-09 — End: 2018-08-24
  Filled 2018-07-13: qty 84, 21d supply, fill #0

## 2018-07-09 NOTE — Unmapped (Signed)
[facility] Specialty Medication Referral: No PA required    Medication (Brand/Generic): CAPECITABINE    Initial Benefits Investigation Claim completed with resulted information below:  No PA required  Patient ABLE to fill at [facility]  Insurance Company:  MEDICAID  Anticipated Copay: $3    As Co-pay is under $25 defined limit, per policy there will be no further investigation of need for financial assistance at this time unless patient requests. This referral has been communicated to the provider and handed off to the [facility] team for further processing and filling of prescribed medication.   ______________________________________________________________________  Please utilize this referral for viewing purposes as it will serve as the central location for all relevant documentation and updates.

## 2018-07-12 ENCOUNTER — Ambulatory Visit: Admit: 2018-07-12 | Discharge: 2018-07-13 | Payer: MEDICARE

## 2018-07-12 ENCOUNTER — Ambulatory Visit: Admit: 2018-07-12 | Discharge: 2018-07-13 | Payer: MEDICARE | Attending: Adult Health | Primary: Adult Health

## 2018-07-12 DIAGNOSIS — C029 Malignant neoplasm of tongue, unspecified: Principal | ICD-10-CM

## 2018-07-12 NOTE — Unmapped (Signed)
Clinical Pharmacist Practitioner: [facility] Ne[facility] Oncology Clinic    HPI: Paula Schmitt is a 63 y.o. female with metastatic head & neck SCC who will be starting treatment with Xeloda (capecitabine). I am calling her son, Paula Schmitt, today for counseling at her request as she has difficulty speaking over the phone.      Oral Chemotherapy Regimen: Xeloda 1500 mg PO BID for 2 weeks on/1 week off.  Medication Access: filled by [facility] for $3 copay    Education Provided:  ? Xeloda should be taken within 30 minutes of a meal.  ? Medication should be stored at room temperature, ideally in original container.  ? Common adverse events, including fatigue, diarrhea, hand-foot syndrome, mucositis, nausea, hair thinning/alopecia, and decreased blood counts (WBC, platelets).  ? Reviewed prophylaxis and management of adverse events, including urea 20% cream for HFS, antiemetics for nausea, and Imodium for diarrhea.  ? Discussed that patient may dissolve tablets in water for ease of administration if needed.    Drug interactions: none    ASSESSMENT & PLAN:  1. Metastatic HNSCC: patient will start treatment with Xeloda 1500 mg BID for 2 weeks on/1 week off.   ?? Cycle 1 start date: Thursday 07/15/18.    F/U: labs and phone call for tox check prior to cycle 2    ----------------------------------------------    Medications:  Current Outpatient Medications   Medication Sig Dispense Refill   ??? AEROCHAMBER PLUS FLOW-VU,M MSK Spcr use as directed with PROAIR INHALER if needed for wheezing  0   ??? capecitabine (XELODA) 500 MG tablet Take 3 tablets (1,500 mg total) by mouth Two (2) times a day . For 14 days on, 7 days off. 84 tablet 1   ??? dronabinol (MARINOL) 5 MG capsule Take 1 capsule (5 mg total) by mouth Two (2) times a day (30 minutes before a meal). 60 capsule 1   ??? gabapentin (NEURONTIN) 100 MG capsule Take 1 capsule (100 mg total) by mouth nightly. 30 capsule 2   ??? lactose-reduced food (ENSURE PLUS ORAL) Take by mouth.     ??? losartan (COZAAR) 25 MG tablet Take 25 mg by mouth.     ??? melatonin 10 mg Tab 10 mg by G-tube route nightly as needed (insomnia). 30 tablet 0   ??? ondansetron (ZOFRAN) 8 MG tablet Take 1 tablet (8 mg total) by mouth every eight (8) hours as needed for nausea (or vomiting). 30 tablet 2   ??? PROAIR HFA 90 mcg/actuation inhaler inhale 2 puffs by mouth every 4 to 6 hours if needed for wheezing  0   ??? prochlorperazine (COMPAZINE) 10 MG tablet Take 1 tablet (10 mg total) by mouth every six (6) hours as needed (Nausea/Vomiting). 30 tablet 2     No current facility-administered medications for this visit.        Laboratory Data:  No visits with results within 1 Day(s) from this visit.   Latest known visit with results is:   Office Visit on 06/28/2018   Component Date Value   ??? Magnesium 06/28/2018 1.9    ??? Sodium 06/28/2018 137    ??? Potassium 06/28/2018 4.0    ??? Chloride 06/28/2018 96*   ??? Anion Gap 06/28/2018 15    ??? CO2 06/28/2018 26.0    ??? BUN 06/28/2018 22*   ??? Creatinine 06/28/2018 0.70    ??? BUN/Creatinine Ratio 06/28/2018 31    ??? EGFR CKD-EPI Non-African* 06/28/2018 >90    ??? EGFR CKD-EPI African Ame* 06/28/2018 >90    ???  Glucose 06/28/2018 91    ??? Calcium 06/28/2018 12.8*   ??? Albumin 06/28/2018 4.0    ??? Total Protein 06/28/2018 7.7    ??? Total Bilirubin 06/28/2018 0.6    ??? AST 06/28/2018 31    ??? ALT 06/28/2018 12    ??? Alkaline Phosphatase 06/28/2018 223*   ??? WBC 06/28/2018 13.7*   ??? RBC 06/28/2018 3.16*   ??? HGB 06/28/2018 8.9*   ??? HCT 06/28/2018 28.9*   ??? MCV 06/28/2018 91.5    ??? [facility] 06/28/2018 28.2    ??? MCHC 06/28/2018 30.8*   ??? RDW 06/28/2018 14.3    ??? MPV 06/28/2018 7.9    ??? Platelet 06/28/2018 493*   ??? Neutrophils % 06/28/2018 82.5    ??? Lymphocytes % 06/28/2018 6.3    ??? Monocytes % 06/28/2018 7.2    ??? Eosinophils % 06/28/2018 1.4    ??? Basophils % 06/28/2018 0.4    ??? Neutrophil Left Shift 06/28/2018 1+*   ??? Absolute Neutrophils 06/28/2018 11.3*   ??? Absolute Lymphocytes 06/28/2018 0.9*   ??? Absolute Monocytes 06/28/2018 1.0*   ??? Absolute Eosinophils 06/28/2018 0.2    ??? Absolute Basophils 06/28/2018 0.1    ??? Large Unstained Cells 06/28/2018 2    ??? Hypochromasia 06/28/2018 Marked*   ??? Smear Review Comments 06/28/2018 See Comment*        Paula Schmitt, PharmD, BCOP, CPP  Head & Neck Clinical Pharmacist Practitioner  Pager: [phone]       -------------------------------------------------------------    [facility] Onboarding    Specialty Medication(s): Capecitabine 1500 mg BID for 14 days on/7 days off  Diagnosis: metastatic head & neck SCC  Refills: 21 day    Patient Name: Paula Schmitt  Patient DOB: [birth year]  Phone: ??[phone] (SON)  Shipping address: [address]  [place] [address]  All above HIPAA information was verified with patient's son, Paula Schmitt.    Next Scheduled Delivery Date: 07/14/2018 from [facility]     Financial/Shipment   Primary Billing: Medicaid  Anticipated copay of $3 reviewed with patient.    Explained that the shipment will be sent to the deliverable address provided via UPS. Discussed that shipment will include a medication information handout and a [facility]. The patient will not have to provide a signature for the package. Informed patient that arrangement of payment method can be done by contacting the pharmacy. Discussed the service provided by [facility] with respects to monthly outbound calls to the patient to set up refill deliveries 7-10 days prior to their subsequent needed refill.  Emphasized need for patient to be reachable in order to schedule medication shipment; the pharmacy must speak to the patient to schedule the refill. The pharmacy is open Monday through Friday, 8:30am to 4:30pm.    Provided phone number to [facility]: [phone].    Patient/Medication Specific   - Patient speaks English, an interpreter is not needed.  - Patient is able to read and understand education materials at a high school level or above.  - Patient does not have any cultural barriers.  - Patient has difficulty speaking.   - Patient has authorized Paula Schmitt, her son to discuss her healthcare needs.     The following items were reviewed:  - Patient's past medical history, comorbidities, and allergies. No contraindications or precautions were identified and therapy is appropriate.  - Patient's medication list in EPIC is up-to-date and no drug interactions were found.  -  Discussed medication storage requirements and safe handling, storage, and disposal of oral chemotherapy.  - Importance of taking medication as directed was discussed, including strategies to improve adherence and what to do in the event of missed doses.  - Counseled on the the hazards surrounding oral chemotherapy and importance of minimizing contact/exposure to the medication.  - Informed patient that a pharmacist is available 24/7 via pager to answer any clinical questions regarding their medications.  - Please refer to full counseling note documented above regarding medication regimen, dosing, administration, and side effects.    All of patient's questions were answered and patient confirmed that they understand how to take their medication. [facility] team will follow up with patient monthly for standard refill processing and delivery.

## 2018-07-12 NOTE — Unmapped (Signed)
07/12/18 spoke with son of Paula Schmitt. He says per Paula Schmitt they will not be scheduling any appointments for a while.

## 2018-07-13 MED FILL — CAPECITABINE 500 MG TABLET: 21 days supply | Qty: 84 | Fill #0 | Status: AC

## 2018-07-14 DIAGNOSIS — C029 Malignant neoplasm of tongue, unspecified: Principal | ICD-10-CM

## 2018-07-14 MED ORDER — GABAPENTIN 100 MG CAPSULE: 200 mg | capsule | Freq: Every evening | 2 refills | 0 days | Status: AC

## 2018-07-14 MED ORDER — GABAPENTIN 100 MG CAPSULE
ORAL_CAPSULE | Freq: Every evening | ORAL | 2 refills | 0.00000 days | Status: CP
Start: 2018-07-14 — End: 2018-07-14

## 2018-07-15 NOTE — Unmapped (Signed)
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 Now with POD currently on Pembro. Right hip/pelvis pain has improved s/p gabapentin initiation. Denies other pain.  No dysphagia.  Reports feeling well, no increasing fatigue, muscle aches.       RECOMMENDATIONS:  Discussed at length via phone with patient and son.  Due to the Covid-19 pandemic, clinical trials are on hold a this time unless no other SOC option is available or additional visits are not required.  Discussed SOC alternatives, oral Xeloda and weekly Methotrexate.  After discussion, patient chose to proceed with Xeloda as she would like to maintain her QOL and coming to clinic weekly did not appeal to her.  Additionally, discussed that she would be better served staying at home and limiting visits due to Covid-19.  Patient and son agreeable and stated she was avoiding going out at present.  Instructed usual cycle would include 2 weeks of BID Xeloda with 1 week off, then return to clinic for labs and OV.  Instructed those visits would be made, however need to come in would be evaluated closer to time of visits.  Briefly discussed potential side effects of Xeloda including, fatigue, n/v, cytopenias, skin.  Instructed our CPP, Lequita Halt would be calling later today to discuss dosing, side effects, coverage/co-pays.     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   - Dose reduced cisplatin (100mg /m2 to 40mg /m2) starting day 21 due to increased mucositis and grade 2 radiation dermatitis in the setting of post-operative infection. Ultimately discontinued due to RT dermatitis (not systemic intolerance to cisplatin).  - PET 07/01/17 showed persistent increased FDG uptake in the anterior right aspect of the floor of the mouth worrisome for persistent disease, new LLL subpleural pulmonary nodule, increase in size and FDG uptake of hypoattenuating mass in the right hepatic lobe (biopsy proven), persistent focal increase in FDG uptake in the tip of the left scapula worrisome for potential osseous metastasis  - CT neck and chest 08/15/17 does not show enhancing lesion at the site of prior FDG uptake in the anterior aspect of the residual right tongue, subcentimeter bilateral pulmonary nodules and hepatic metastases are stable  - Discussed limitations of CT imaging for her H&N mass, will attempt to obtain insurance approval for PET intermittently to continue to follow this lesion, ideally with visualization by Dr. Azucena Fallen with ENT as well  -  Palliative modified Haraf with carboplatin and paclitaxel weekly x2 followed by 3rd week off, omit cetuximab due to elevated alpha gal.    -- Single agent pembrolizumab not indicated 1st line R/M given CPS score 1  - STRATA with MDM2 amplification, TP53 p.C176Y (limited sample prohibited ability to detect deep deletions)  - Further DR Taxol given ongoing intolerable grade 2 fatigue, resolved.  - PET 03/15/18 revealed new left mandibular lesion, mild increase of LLL nodule, liver lesion enlarged from 7.4cm to 10.4cm.   - PET 06/07/2018 with PD of liver lesion from 10.4cm to 13.4 cm.  Other sites mostly stable.    -  Considering clinical trials, however Covid-19 issues prevented enrollment.      Hypercalcemia  - 12.8 on 06/28/2018.  Xgeva on 06/29/2018.  No labs today, denies fatigue, muscle weakness, confusion, increased thirst.  Son/patient will call if any s/s develop and will plan to check at next appt, sooner if needed.  Will increase oral water intake.      Weight Loss:  - Continued gradual weight loss noted at last visit.  Son/patient will start weighing at home and recording.  Will call if continues to decline.      - Continue Marinol to 5mg  BID. Consider increase at next visit if continued anorexia. Reports appetite stable at present, states thinks Marinol has helped.     - Encouraged increased intake b/w breakfast and dinner. Patient will try to have at least 2-3 Boosts in the interval b/w dinner and breakfast.   - Nutrition consult -completed    Dizziness/Vision Changes: (06/28/2018 visit)Patient described white circle in the center of her vision that last several minutes with associated dizziness but resolved on its own prior to arrival to her clinic appointment. Less suspicious of central retinal artery/vein occlusion given bilateral nature of her symptoms. No HA to suggest GCA. No palpitations, syncope or atrial fibrillation on exam to suggest embolic or cardiovascular nature of her symptoms. Resolved symptoms so less likely retinal detachment or vitreous hemorrhage. Could be a direct manifestation of her malignancy. No neuro deficits on exam today to suggest intracranial metastasis (and would be rare for [facility]) but certainly a possibility. Another consideration would be a consequence of her worsening hypercalcemia. No complaints of dizziness with standing but certainly orthostasis in the differential especially given hypercalcemia on labs.   - Referral to optho if recurs  - Could consider carotid ultrasound   - Patient to call/go to ED for recurrence   - Consider intracranial imaging if recurs   - Treat hypercalcemia as above  -Denies further issue.      Right hip pain    - Gabapentin has significantly improved her right hip pain.  Will increase to 200mg  at HS, 200mg  during the day if needed.    - Continue Gabapentin and Tylenol prn  - No focal pain with palpation.    - X-ray revealed no fracture, or other pelvic reason for pain.  Lumbar degenerative disc disease only which could cause referred pain to the hip. Discussed findings of x-ray with patient and son.    Dyspnea on Exertion  - Neg CTA chest   - EKG NSR, no ST changes or Q waves  - CK-MB neg, Pro-BNP elevated (BNP very elevated 1 year ago in setting of pneumonia with normal TTE)  - Nl TTE on 11/23/17  - ongoing anemia of chronic disease and chemotherapy induced may be the underlying factor.  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  Weight down to 77lbs at last visit.  See above for recommendations.        Tobacco abuse: has quit  Tinnitus: Mild, chronic    Cancer Treatment Pain: None    Social: Good support - son who is a International aid/development worker present with patient via phone today.  He plans omn taking FMLA to help care for his mother.       Dispo:  Oral Xeloda for PD.  CPP will dose and call patient/son.  Appt in 3 weeks after initiating Xeloda with labs.  (Poss phone visit due to Covid-19 restrictions).      HISTORY OF PRESENT ILLNESS:    Onc hx as detailed below. I personally verified the history and adjusted as necessary:  Oncology History    July 2017 - mouth pain initially thought to be gingivitis, eventually progressing to left otalgia and limitations in tongue mobility affecting speech/swallowing.  ??  02/01/16 ??? seen by Dr. Willeen Cass at [facility] with plans for biopsy of the tongue.   ??  02/01/16 ??? biopsy of the left oral tongue shows moderately differentiated SCC.  ??  02/07/16 ??? CT neck w/ contrast performed at [facility] shows a LEFT oral tongue lesion (2.8cm) extending to tip of tongue and to midline, involving anterior genioglossus muscle on left (and possibly right) and invasion into anterior floor of mouth with focal erosion of midline mandible.  Also showed necrotic lymph nodes bilaterally (1.3cm LEFT level 2 and 1.2cm LEFT level 1b and <1cm RIGHT level 1b).  ??  02/20/16 ??? seen by Dr. Azucena Fallen at [facility] who notes firm LFET oral tongue, with tongue tethered to the anterior mandible and limited mobility and lesion along the anterior FOM extending down the left FOM posteriorly.  Left lateral oral tongue is similarly contiguously involved.  Lesion abuts anterior lingual cortex of the mandible.  ??  02/20/16 ??? CT chest is negative for metastatic disease.        Tongue cancer (CMS-HCC)    02/20/2016 Initial Diagnosis     Tongue cancer (RAF-HCC)      07/06/2017 - 03/21/2018 Chemotherapy     OP HEAD/NECK CARBOPLATIN/PACLITAXEL (HARAF)  PACLitaxel 135 mg/m2, CARBOplatin AUC2 weekly      03/29/2018 -  Chemotherapy     OP HEAD/NECK PEMBROLIZUMAB  pembrolizumab 200 mg IV on day 1, every 21 days       INTERVAL HISTORY: Today (A history was conducted today with the same results as the history from my last visit, with differences noted below):    Right hip pain is better with initiation of Tylenol and increase of Gabapentin. Using only Tylenol and gabapentin.   Denies any constipation, abdominal pain, confusion, muscle weakness, increased fatigue.  Patient fatigued but able to completed ADLs including cooking and walking.   Continues to have weight loss. Down 3 pounds in one week at last visit, will monitor weight at home.   Appetite remains decreased, but stable with Marinol 5mg  BID. Marland Kitchen Endorses a very large breakfast but will only have one Boost b/w breakfast and dinner. Dinner usually not as much intake compared to breakfast.    Denies any DOE/SOB  Denies pruritis   Her neuropathy is resolved  No dysphagia, eating regular foods.        REVIEW OF SYSTEMS:  A comprehensive review of 10 systems was negative except for pertinent positives noted in HPI.    Past Medical History:   Diagnosis Date   ??? Hypertension    ??? Oral cancer (CMS-HCC)       Past Surgical History:   Procedure Laterality Date   ??? IR INSERT G-TUBE PERCUTANEOUS  03/27/2016    IR INSERT G-TUBE PERCUTANEOUS 03/27/2016 Rush Barer, MD IMG VIR [facility]   ??? PR BONE-SKIN GRAFT, MICROVASCULAR Left 03/31/2016    Procedure: Free Osteocutaneous Flap With Microvascular Anastomosis; Other Than Iliac Crest, Metatarsal Or Great Toe;  Surgeon: Noe Gens, MD;  Location: MAIN [facility];  Service: ENT   ??? PR EXCIS TONGUE,MOUTH,JAW Bilateral 03/31/2016    Procedure: GLOSSECTOMY; COMPOSITE WO RADICAL NECK DISSECT;  Surgeon: Lauralee Evener, MD;  Location: MAIN OR [facility];  Service: ENT   ??? PR EXPLORATION N/FLWD SURG NECK ARTERY Left 03/31/2016    Procedure: Exploration (Not Followed By Surgical Repair), With Or Without Lysis Or Artery; Carotid Artery;  Surgeon: Noe Gens, MD;  Location: MAIN OR [facility];  Service: ENT   ??? PR FREE MUSC-SKIN FLAP W/MICROVASC ANAST Left 03/31/2016    Procedure: Free Muscle Or Myocutaneous Flap With Microvascular Anastomosis;  Surgeon: Noe Gens, MD;  Location: MAIN OR [facility];  Service: ENT   ??? PR OPEN RX MANDIBLE FX Bilateral 03/31/2016    Procedure: Open Tx Mandib Fx; Wo Interdental Fixa;  Surgeon: Noe Gens, MD;  Location: MAIN OR [facility];  Service: ENT   ??? PR RECONSTR JAW,FULL-SUB IMPLNT Bilateral 03/31/2016    Procedure: RECON MANDIB/MAXIL SUBPERIOSTEAL IMPLNT; COMPL;  Surgeon: Lauralee Evener, MD;  Location: MAIN OR [place];  Service: ENT   ??? PR REMOVAL NODES, NECK,CERV MOD RAD Bilateral 03/31/2016    Procedure: CERVICAL LYMPHADENECTOMY (MODIFIED RADICAL NECK DISSECTION);  Surgeon: Lauralee Evener, MD;  Location: MAIN OR [facility];  Service: ENT   ??? PR [facility] TUBE CHANGE Midline 03/31/2016    Procedure: Tracheotomy Tube Change Befor Estab Fistula Trac;  Surgeon: Noe Gens, MD;  Location: MAIN OR [facility];  Service: ENT   ??? PR TRACHEOSTOMY, PLANNED Midline 03/31/2016    Procedure: TRACHEOSTOMY PLANNED (SEPART PROC);  Surgeon: Lauralee Evener, MD;  Location: MAIN OR [facility];  Service: ENT      Family History   Problem Relation Age of Onset   ??? Heart failure Father    ??? Cancer Sister         Thyroid   ??? Diabetes Neg Hx      Social History     Occupational History   ??? Not on file   Tobacco Use   ??? Smoking status: Former Smoker     Packs/day: 1.00     Years: 35.00     Pack years: 35.00     Types: Cigarettes     Last attempt to quit: 03/05/2016     Years since quitting: 2.3   ??? Smokeless tobacco: Never Used   ??? Tobacco comment: Has neen referred to smoking cessation   Substance and Sexual Activity   ??? Alcohol use: Yes     Comment: occ   ??? Drug use: No   ??? Sexual activity: Not on file   Lives alone in [place] area. Adult son lives in [place]. Son staying with her during CRT    Pregnancy status -- postmenapausal per pt    ALLERGIES/MEDICATIONS:  Reviewed in EPIC    PHYSICAL EXAM:  No PE performed as visit is via phone.    RADIOLOGY:  Imaging was personally viewed as summarized in the HPI and detailed below.   CT Neck 08/15/17:  - Previously identified FDG uptake in the anterior aspect of the residual right tongue does not correlate with a definite enhancing lesion. Recommend correlation with direct examination.   -  Uptake along the left maxilla could reflect dental disease as there are multiple dental caries and periapical lucencies in the maxillary molars at this location.  - High-grade proximal ICA stenosis bilaterally. See above.    CT Chest 08/15/17:  - Stable subcentimeter bilateral pulmonary nodules as described above. No new pulmonary nodules  - Similar-appearing hepatic metastases.    PET 07/01/17:  - Persistent abnormal increase in FDG uptake in the anterior right aspect of the floor the mouth. There is no definite CT correlation however this is worrisome for persistent disease. Recommend further evaluation with clinical exam.  -Interval development of a new hypermetabolic left lower lobe subpleural pulmonary nodule and an increase in the size and FDG uptake of the ??hypoattenuating mass in the right hepatic lobe. ??These ??findings are consistent with ??metastatic disease.  - Persistent focal increase in FDG uptake in the tip of the left scapula corresponding to an ill-defined lytic lesion. This is worrisome for potential osseous metastasis.    PET 11/30/17  -- Persistent mild increased FDG uptake in the anterior aspect of the floor of mouth which has decreased when compared to prior imaging. There is no definitive CT correlate. This may represent posttreatment changes however underlying persistent disease cannot be excluded.  --Interval decrease in the FDG uptake in the 0.6 cm FDG avid left lower lobe subpleural pulmonary nodule, when compared prior imaging. This is still consistent with metastatic disease.  --Redemonstration of the 7.4 cm hypoattenuating, FDG avid right hepatic lesion which now shows FDG uptake throughout the lesion and not just predominantly in the periphery when compared prior imaging. This may represent a decrease in the central necrosis.  -- Redemonstrated minimal increased FDG uptake in the tip of the left scapula with no definitive CT correlate identified. Question responding osseous metastatic lesion.    PET 03/15/18  - Enlarging hypermetabolic liver lesion suggestive of worsening disease. 10.4 cm  - Enlarging FDG avid left lower lobe pulmonary nodule, worrisome for pulmonary metastasis. 5mm  - There appears to be a new FDG avid soft tissue focus superior to the left mandibular construction site, measuring about 0.9 cm on noncontrast CT. This could represent local recurrence. Clinical correlation and/or contrasted CT correlation is recommended.    PET 06/07/2018  IMPRESSION:  -Increasing size of centrally necrotic right hepatic lobe lesion now measuring up to 13.2 cm.??  -New porta hepatic and portacaval nodes consistent with metastatic disease??  -Increasing size of FDG avid left lower lobe pulmonary nodule consistent with metastatic disease.??  -FDG avid focus in the right mandibular reconstruction without CT correlate. This could represent a focus of infection versus local recurrence. Correlation with physical exam and attention on follow-up.  ??    PATHOLOGY:   Pathology personally reviewed. See epic for details, in brief:   Floor of mouth, mandible, left oral tongue and right hemioral tongue, composite resection   - Invasive squamous cell carcinoma, poorly differentiated, focally keratinizing, 2.6 cm, with associated carcinoma in situ, see synoptic summary  - Invasive carcinoma invades through the anterior mandible involving anterior soft tissue and is located 0.3 cm from the anterior soft tissue resection margin  - Invasive carcinoma involves anterior floor of mouth and ventral tongue with extension into tongue skeletal muscle, approaching the dorsal tongue epithelial surface  - Extensive perineural invasion is identified  - Lymphovascular invasion is identified  - Mandibular and specimen resection margins are negative for high grade dysplasia and invasive carcinoma  9/104 Lymph nodes positive for cancer with +ECE  LABS:  NA-Phone visit       Approximately 25 min spent in discussion via phone with patient and son.  Instructed phone call was in lieu of in person visit but considered a visit with possible co-pay per insurance.

## 2018-07-15 NOTE — Unmapped (Addendum)
I spent 15 minutes on the phone with the patient. I spent an additional 15 minutes on pre- and post-visit activities.     The patient was physically located in [place] or a state in which I am permitted to provide care. The patient understood that s/he may incur co-pays and cost sharing, and agreed to the telemedicine visit. The visit was completed via phone and/or video, which was appropriate and reasonable under the circumstances given the patient's presentation at the time.    The patient has been advised of the potential risks and limitations of this mode of treatment (including, but not limited to, the absence of in-person examination) and has agreed to be treated using telemedicine. The patient's/patient's family's questions regarding telemedicine have been answered.     If the phone/video visit was completed in an ambulatory setting, the patient has also been advised to contact their provider???s office for worsening conditions, and seek emergency medical treatment and/or call 911 if the patient deems either necessary.      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 Now with POD currently on Pembro. Right hip/pelvis pain has improved s/p gabapentin initiation. Denies other pain.  No dysphagia.  Weight down approx 3 lbs with reported anorexia as cause for weight loss. Worsened hypercalcemia (11.8 > 12.8) even s/p Zometa and IVF. Episode of vision changes and dizziness prior to arrival in clinic today of unclear etiology.     RECOMMENDATIONS:  Will continue pembro while making arrangements for clinical trial. Family to decide b/w nanobiotix vs SGNTV trial. Both options presented to the patient today and has meeting with radiation oncology to discuss in more detail. Hypercalcemia persistent and will plan treatment with Xgeva; will need to resolve prior to enrollment in trial. Patient with continued weight loss - Nutrition to speak with patient. Can consider increasing dronabinol for appetite stimulation.  New onset vision changes and dizziness today that resolved prior to assessment with further discussion below. Would continue gabapentin for hip pain.     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 Dose reduced cisplatin (100mg /m2 to 40mg /m2) starting day 21 due to increased mucositis and grade 2 radiation dermatitis in the setting of post-operative infection. Ultimately discontinued due to RT dermatitis (not systemic intolerance to cisplatin).  - PET 07/01/17 showed persistent increased FDG uptake in the anterior right aspect of the floor of the mouth worrisome for persistent disease, new LLL subpleural pulmonary nodule, increase in size and FDG uptake of hypoattenuating mass in the right hepatic lobe (biopsy proven), persistent focal increase in FDG uptake in the tip of the left scapula worrisome for potential osseous metastasis  - CT neck and chest 08/15/17 does not show enhancing lesion at the site of prior FDG uptake in the anterior aspect of the residual right tongue, subcentimeter bilateral pulmonary nodules and hepatic metastases are stable  - Discussed limitations of CT imaging for her H&N mass, will attempt to obtain insurance approval for PET intermittently to continue to follow this lesion, ideally with visualization by Dr. Azucena Fallen  with ENT as well  -  Palliative modified Haraf with carboplatin and paclitaxel weekly x2 followed by 3rd week off, omit cetuximab due to elevated alpha gal.    -- Single agent pembrolizumab not indicated 1st line R/M given CPS score 1  - STRATA with MDM2 amplification, TP53 p.C176Y (limited sample prohibited ability to detect deep deletions)  - Further DR Taxol given ongoing intolerable grade 2 fatigue, resolved.  - PET 03/15/18 revealed new left mandibular lesion, mild increase of LLL nodule, liver lesion enlarged from 7.4cm to 10.4cm.   - PET 06/07/2018 with PD of liver lesion from 10.4cm to 13.4 cm.  Other sites mostly stable.    - Continue Pembro for now, pending workup/enrollment in clinical trial.  Denies diarrhea, dyspnea, rash, temp intolerance, hair loss.       - Patient/familty to consider nanobiotix trial (liver injection and radiation x 5 days) vs. SGNTV trial (weekly IV infusions).    Hypercalcemia  - 12.8 today from 11.8 last week and s/p Zometa and IVF. Will need to be resolved prior to enrollment in trial.   - Will plan for Xgeva infusion Tuesday or Wednesday (3/10 vs 3/11).         Weight Loss:  - Continued gradual weight loss, describes poor appetite.    - Continue Marinol to 5mg  BID. Consider increase at next visit if continued anorexia.   - Encouraged increased intake b/w breakfast and dinner. Patient will try to have at least 2-3 Boosts in the interval b/w dinner and breakfast.   - Nutrition consult     Dizziness/Vision Changes: Patient described white circle in the center of her vision that last several minutes with associated dizziness but resolved on its own prior to arrival to her clinic appointment. Less suspicious of central retinal artery/vein occlusion given bilateral nature of her symptoms. No HA to suggest GCA. No palpitations, syncope or atrial fibrillation on exam to suggest embolic or cardiovascular nature of her symptoms. Resolved symptoms so less likely retinal detachment or vitreous hemorrhage. Could be a direct manifestation of her malignancy. No neuro deficits on exam today to suggest intracranial metastasis (and would be rare for [facility]) but certainly a possibility. Another consideration would be a consequence of her worsening hypercalcemia. No complaints of dizziness with standing but certainly orthostasis in the differential especially given hypercalcemia on labs.   - Referral to optho if recurs  - Could consider carotid ultrasound   - Patient to call/go to ED for recurrence   - Consider intracranial imaging if recurs   - Treat hypercalcemia as above    Right hip pain  - Waiting on PET to see if pathologic process is causing pain.    - Gabapentin has significantly improved her right hip pain.  - Continue Gabapentin and Tylenol prn  - No focal pain with palpation.    - X-ray revealed no fracture, or other pelvic reason for pain.  Lumbar degenerative disc disease only which could cause referred pain to the hip.     Dyspnea on Exertion  - Neg CTA chest   - EKG NSR, no ST changes or Q waves  - CK-MB neg, Pro-BNP elevated (BNP very elevated 1 year ago in setting of pneumonia with normal TTE)  - Nl TTE on 11/23/17  - ongoing anemia of chronic disease and chemotherapy induced may be the underlying factor.  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  Weight down to 77lbs.  See above for recommendations.        Tobacco abuse: has quit    Tinnitus: Mild, chronic    Cancer Treatment Pain: None    Social: Good support - son who is a International aid/development worker present at visit today     Dispo: Treat hypercalcemia. Consideration of Nanobiotix vs SGNTV trial.     Joyice Faster MD, PharmD   [facility] PGY-1 Internal Medicine    Patient seen and discussed with Dr. Loree Fee       HISTORY OF PRESENT ILLNESS:    Onc hx as detailed below. I personally verified the history and adjusted as necessary:  Oncology History    July 2017 - mouth pain initially thought to be gingivitis, eventually progressing to left otalgia and limitations in tongue mobility affecting speech/swallowing.  ??  02/01/16 ??? seen by Dr. Willeen Cass at [facility] with plans for biopsy of the tongue.   ??  02/01/16 ??? biopsy of the left oral tongue shows moderately differentiated SCC.  ??  02/07/16 ??? CT neck w/ contrast performed at [facility] shows a LEFT oral tongue lesion (2.8cm) extending to tip of tongue and to midline, involving anterior genioglossus muscle on left (and possibly right) and invasion into anterior floor of mouth with focal erosion of midline mandible.  Also showed necrotic lymph nodes bilaterally (1.3cm LEFT level 2 and 1.2cm LEFT level 1b and <1cm RIGHT level 1b).  ??  02/20/16 ??? seen by Dr. Azucena Fallen at [facility] who notes firm LFET oral tongue, with tongue tethered to the anterior mandible and limited mobility and lesion along the anterior FOM extending down the left FOM posteriorly.  Left lateral oral tongue is similarly contiguously involved.  Lesion abuts anterior lingual cortex of the mandible.  ??  02/20/16 ??? CT chest is negative for metastatic disease.        Tongue cancer (CMS-HCC)    02/20/2016 Initial Diagnosis     Tongue cancer (RAF-HCC)      07/06/2017 - 03/21/2018 Chemotherapy     OP HEAD/NECK CARBOPLATIN/PACLITAXEL (HARAF)  PACLitaxel 135 mg/m2, CARBOplatin AUC2 weekly      03/29/2018 -  Chemotherapy     OP HEAD/NECK PEMBROLIZUMAB  pembrolizumab 200 mg IV on day 1, every 21 days       INTERVAL HISTORY: Today (A history was conducted today with the same results as the history from my last visit, with differences noted below):      She presented today with her son.  Right hip pain is better with initiation of Tylenol. Using only Tylenol and gabapentin.   Denies any constipation, abdominal pain. Son has not been w/ her in the past week but does not think she has been acting more confused.   Patient fatigued but able to completed ADLs including cooking and walking.   Continues to have weight loss. Down 3 pounds in one week.   Appetite remains decreased. Endorses a very large breakfast but will only have one Boost b/w breakfast and dinner. Dinner usually not as much intake compared to breakfast.  Last saw a nutritionist in 2017.   Marinol seems to be helping appetite some per the patient.  Denies any DOE/SOB  Denies pruritis   Her neuropathy is resolved  No dysphagia, eating regular foods.        REVIEW OF SYSTEMS:  A comprehensive review of 10 systems was negative except for pertinent positives noted in HPI.    Past Medical History:   Diagnosis Date   ??? Hypertension    ??? Oral cancer (CMS-HCC)       Past Surgical History:   Procedure Laterality Date   ??? IR INSERT G-TUBE PERCUTANEOUS  03/27/2016    IR INSERT G-TUBE PERCUTANEOUS 03/27/2016 Rush Barer, MD IMG VIR H[facility]V [facility]   ??? PR BONE-SKIN GRAFT, MICROVASCULAR Left 03/31/2016    Procedure: Free Osteocutaneous Flap With Microvascular Anastomosis; Other Than Iliac Crest, Metatarsal Or Great Toe;  Surgeon: Noe Gens, MD;  Location: MAIN OR [facility];  Service: ENT   ??? PR EXCIS TONGUE,MOUTH,JAW Bilateral 03/31/2016    Procedure: GLOSSECTOMY; COMPOSITE WO RADICAL NECK DISSECT;  Surgeon: Lauralee Evener, MD;  Location: MAIN [facility];  Service: ENT   ??? PR EXPLORATION N/FLWD SURG NECK ARTERY Left 03/31/2016    Procedure: Exploration (Not Followed By Surgical Repair), With Or Without Lysis Or Artery; Carotid Artery;  Surgeon: Noe Gens, MD;  Location: MAIN OR [facility];  Service: ENT   ??? PR FREE MUSC-SKIN FLAP W/MICROVASC ANAST Left 03/31/2016    Procedure: Free Muscle Or Myocutaneous Flap With Microvascular Anastomosis;  Surgeon: Noe Gens, MD;  Location: MAIN OR [facility];  Service: ENT   ??? PR OPEN RX MANDIBLE FX Bilateral 03/31/2016    Procedure: Open Tx Mandib Fx; Wo Interdental Fixa;  Surgeon: Noe Gens, MD;  Location: MAIN [facility];  Service: ENT   ??? PR RECONSTR JAW,FULL-SUB IMPLNT Bilateral 03/31/2016    Procedure: RECON MANDIB/MAXIL SUBPERIOSTEAL IMPLNT; COMPL;  Surgeon: Lauralee Evener, MD;  Location: MAIN OR [place];  Service: ENT   ??? PR REMOVAL NODES, NECK,CERV MOD RAD Bilateral 03/31/2016    Procedure: CERVICAL LYMPHADENECTOMY (MODIFIED RADICAL NECK DISSECTION);  Surgeon: Lauralee Evener, MD;  Location: MAIN OR [facility];  Service: ENT   ??? PR [facility] TUBE CHANGE Midline 03/31/2016    Procedure: Tracheotomy Tube Change Befor Estab Fistula Trac;  Surgeon: Noe Gens, MD;  Location: MAIN OR [facility];  Service: ENT   ??? PR TRACHEOSTOMY, PLANNED Midline 03/31/2016    Procedure: TRACHEOSTOMY PLANNED (SEPART PROC);  Surgeon: Lauralee Evener, MD;  Location: MAIN OR [facility];  Service: ENT      Family History   Problem Relation Age of Onset   ??? Heart failure Father    ??? Cancer Sister         Thyroid   ??? Diabetes Neg Hx      Social History     Occupational History   ??? Not on file   Tobacco Use   ??? Smoking status: Former Smoker     Packs/day: 1.00     Years: 35.00     Pack years: 35.00     Types: Cigarettes     Last attempt to quit: 03/05/2016     Years since quitting: 2.3   ??? Smokeless tobacco: Never Used   ??? Tobacco comment: Has neen referred to smoking cessation   Substance and Sexual Activity   ??? Alcohol use: Yes     Comment: occ   ??? Drug use: No   ???  Sexual activity: Not on file   Lives alone in [place] area. Adult son lives in [place]. Son staying with her during CRT    Pregnancy status -- postmenapausal per pt    ALLERGIES/MEDICATIONS:  Reviewed in EPIC    PHYSICAL EXAM: An exam was conducted today with the same results as the exam from my last visit, with differences noted below:    Vital Signs for this encounter, reviewed in chart:  BSA: There is no height or weight on file to calculate BSA.  There were no vitals taken for this visit.   Gen: Well-appearing woman in NAD, appears older than stated age, accompanied by son. ECOG PS 0-1  HEENT: EOMI, sclera anicteric. Oral cavity: no masses visualized or palpable.  Flap intact.  Neck: Supple, trachea midline  Lymph: No cervical or supraclavicular lymphadenopathy   CV: RRR, no murmurs  Lungs: CTAB w/o rales, rhochi, or wheezing, good respiratory effort   GI: Soft, NT, ND, no masses appreciated  Ext: Warm and well perfused. No edema bilaterally   Skin: no rashes  Neuro: Moving all extremities, gait intact, speech mumbled, no focal neurologic deficits  Psych: Mood euthymic and affect appropriate    RADIOLOGY:  Imaging was personally viewed as summarized in the HPI and detailed below.   CT Neck 08/15/17:  - Previously identified FDG uptake in the anterior aspect of the residual right tongue does not correlate with a definite enhancing lesion. Recommend correlation with direct examination.   - Uptake along the left maxilla could reflect dental disease as there are multiple dental caries and periapical lucencies in the maxillary molars at this location.  - High-grade proximal ICA stenosis bilaterally. See above.    CT Chest 08/15/17:  - Stable subcentimeter bilateral pulmonary nodules as described above. No new pulmonary nodules  - Similar-appearing hepatic metastases.    PET 07/01/17:  - Persistent abnormal increase in FDG uptake in the anterior right aspect of the floor the mouth. There is no definite CT correlation however this is worrisome for persistent disease. Recommend further evaluation with clinical exam.  -Interval development of a new hypermetabolic left lower lobe subpleural pulmonary nodule and an increase in the size and FDG uptake of the ??hypoattenuating mass in the right hepatic lobe. ??These ??findings are consistent with ??metastatic disease.  - Persistent focal increase in FDG uptake in the tip of the left scapula corresponding to an ill-defined lytic lesion. This is worrisome for potential osseous metastasis. PET 11/30/17  -- Persistent mild increased FDG uptake in the anterior aspect of the floor of mouth which has decreased when compared to prior imaging. There is no definitive CT correlate. This may represent posttreatment changes however underlying persistent disease cannot be excluded.  --Interval decrease in the FDG uptake in the 0.6 cm FDG avid left lower lobe subpleural pulmonary nodule, when compared prior imaging. This is still consistent with metastatic disease.  --Redemonstration of the 7.4 cm hypoattenuating, FDG avid right hepatic lesion which now shows FDG uptake throughout the lesion and not just predominantly in the periphery when compared prior imaging. This may represent a decrease in the central necrosis.  -- Redemonstrated minimal increased FDG uptake in the tip of the left scapula with no definitive CT correlate identified. Question responding osseous metastatic lesion.    PET 03/15/18  - Enlarging hypermetabolic liver lesion suggestive of worsening disease. 10.4 cm  - Enlarging FDG avid left lower lobe pulmonary nodule, worrisome for pulmonary metastasis. 5mm  - There appears to be  a new FDG avid soft tissue focus superior to the left mandibular construction site, measuring about 0.9 cm on noncontrast CT. This could represent local recurrence. Clinical correlation and/or contrasted CT correlation is recommended.    PET 06/07/2018  IMPRESSION:  -Increasing size of centrally necrotic right hepatic lobe lesion now measuring up to 13.2 cm.??  -New porta hepatic and portacaval nodes consistent with metastatic disease??  -Increasing size of FDG avid left lower lobe pulmonary nodule consistent with metastatic disease.??  -FDG avid focus in the right mandibular reconstruction without CT correlate. This could represent a focus of infection versus local recurrence. Correlation with physical exam and attention on follow-up.  ??    PATHOLOGY:   Pathology personally reviewed. See epic for details, in brief:   Floor of mouth, mandible, left oral tongue and right hemioral tongue, composite resection   - Invasive squamous cell carcinoma, poorly differentiated, focally keratinizing, 2.6 cm, with associated carcinoma in situ, see synoptic summary  - Invasive carcinoma invades through the anterior mandible involving anterior soft tissue and is located 0.3 cm from the anterior soft tissue resection margin  - Invasive carcinoma involves anterior floor of mouth and ventral tongue with extension into tongue skeletal muscle, approaching the dorsal tongue epithelial surface  - Extensive perineural invasion is identified  - Lymphovascular invasion is identified  - Mandibular and specimen resection margins are negative for high grade dysplasia and invasive carcinoma  9/104 Lymph nodes positive for cancer with +ECE    LABS:  Reviewed in EPIC

## 2018-07-23 ENCOUNTER — Institutional Professional Consult (permissible substitution): Admit: 2018-07-23 | Discharge: 2018-07-24 | Payer: MEDICARE | Attending: Oncology | Primary: Oncology

## 2018-07-23 DIAGNOSIS — C029 Malignant neoplasm of tongue, unspecified: Principal | ICD-10-CM

## 2018-07-23 NOTE — Unmapped (Signed)
Addended by: Lajuana Matte. on: 07/23/2018 02:36 PM     Modules accepted: Orders

## 2018-07-26 NOTE — Unmapped (Signed)
Phone Visit  Patient approved appointment change: yes  Patient demographics and insurance updated: yes  MSPQ complete: N/A  Instructions provided for phone visit: yes

## 2018-07-28 MED ORDER — LIDOCAINE-DIPHENHYD-AL-MAG-SIM 200 MG-25 MG-400 MG-40MG/30ML MOUTHWASH: mL | 1 refills | 0 days | Status: AC

## 2018-07-28 MED ORDER — LIDOCAINE-DIPHENHYD-AL-MAG-SIM 200 MG-25 MG-400 MG-40MG/30ML MOUTHWASH
1 refills | 0.00000 days | Status: CP
Start: 2018-07-28 — End: 2018-08-31

## 2018-07-29 NOTE — Unmapped (Signed)
Clinical Pharmacist Practitioner: Telephone Note    HPI: Paula Schmitt is a 63 y.o. female with metastatic HNSCC who I am calling today for toxicity management.     Subjective/Objective:  Called patient and son, [name], regarding MyChart message. She started to develop mouth sores on Monday. Thinks there are only a few sores but hard to tell as some areas of her mouth are numb (due to previous surgeries and RT). She does not describe pain but rather discomfort in her gums and tongue. She took her last dose of Xeloda this morning.    Assessment/Plan:  Experiencing mucositis from Xeloda. Discussed that this should resolve over the next week now that she has finished Xeloda but we may decrease the dose of her next cycle to prevent recurrence. Sent Rx for magic mouthwash to use PRN pain. She will let us know if mucositis worsens.     Approximate time spent with patient: 10 minutes.    Bobby Rumpf, PharmD, BCOP, CPP  Head & Neck Clinical Pharmacist Practitioner  Pager: [phone]

## 2018-07-29 NOTE — Unmapped (Signed)
Clinical Pharmacist Practitioner: [facility]cology [facility]    Patient Name: Paula Schmitt  Patient Age: 63 y.o.  Encounter Date: 07/23/2018  Primary Oncologist: Dawson Bills, MD    Reason for call: oral chemotherapy follow-up    Assessment & Plan:   1. Metastatic HNSCC: started cycle 1 of Xeloda on 3/25. Currently on day 10 and tolerating well with no significant toxicities at this time.   ?? Continue Xeloda 1500 mg BID for 14 days on/7 days off  ?? Local labs and phone visit prior to cycle 2 (planned for 4/15)    2. Back/hip pain: improved after increasing gabapentin from 200 mg daily to BID. Still having some breakthrough pain between doses so will add additional afternoon dose.  ?? Increase gabapentin to 200 mg TID    F/U: phone visit with Dr. Loree Fee on 4/14    ------------------------------------------------------------------------------------------------------  I spent 15 minutes on the phone with the patient. I spent an additional 20 minutes on pre- and post-visit activities.     The patient was physically located in [place] or a state in which I am permitted to provide care. The patient understood that s/he may incur co-pays and cost sharing, and agreed to the telemedicine visit. The visit was completed via phone and/or video, which was appropriate and reasonable under the circumstances given the patient's presentation at the time.    The patient has been advised of the potential risks and limitations of this mode of treatment (including, but not limited to, the absence of in-person examination) and has agreed to be treated using telemedicine. The patient's/patient's family's questions regarding telemedicine have been answered.     If the phone/video visit was completed in an ambulatory setting, the patient has also been advised to contact their provider???s office for worsening conditions, and seek emergency medical treatment and/or call 911 if the patient deems either necessary. ------------------------------------------------------------------------------------------------------    HPI: Paula Schmitt is a 63 y.o. female with metastatic head & neck SCC who I am calling today for monitoring of Xeloda.    Oral chemotherapy: Xeloda 1500 mg BID for 14 days on/7 days off  Start date: 07/14/18 (cycle 1)  Specialty pharmacy: [facility]  Copay: $3    Interim History: called for mid-cycle toxicity check. She is currently on day 10 of Xeloda and feels well. Fatigue is poor but unchanged from prior to starting treatment. After taking Xeloda dose, she has some light-headedness that lasts for ~5 minutes. She is sitting down after taking the dose so she does not fall. Denies mouth sores, nausea, vomiting, hand-foot syndrome. Using urea cream daily. Denies diarrhea but experienced some constipation earlier this week that resolved with Miralax. Increased dose of gabapentin has helped back/hip pain but she is having some breakthrough pain between morning and evening dose.     Adherence: no issues    Drug-Drug Interactions: none    Oncology History:  Oncology History    July 2017 - mouth pain initially thought to be gingivitis, eventually progressing to left otalgia and limitations in tongue mobility affecting speech/swallowing.  ??  02/01/16 ??? seen by Dr. Willeen Cass at [facility] with plans for biopsy of the tongue.   ??  02/01/16 ??? biopsy of the left oral tongue shows moderately differentiated SCC.  ??  02/07/16 ??? CT neck w/ contrast performed at [facility] shows a LEFT oral tongue lesion (2.8cm) extending to tip of tongue and to midline, involving anterior genioglossus muscle on left (and possibly right) and invasion  into anterior floor of mouth with focal erosion of midline mandible.  Also showed necrotic lymph nodes bilaterally (1.3cm LEFT level 2 and 1.2cm LEFT level 1b and <1cm RIGHT level 1b).  ??  02/20/16 ??? seen by Dr. Azucena Fallen at [facility] who notes firm LFET oral tongue, with tongue tethered to the anterior mandible and limited mobility and lesion along the anterior FOM extending down the left FOM posteriorly.  Left lateral oral tongue is similarly contiguously involved.  Lesion abuts anterior lingual cortex of the mandible.  ??  02/20/16 ??? CT chest is negative for metastatic disease.        Tongue cancer (CMS-HCC)    02/20/2016 Initial Diagnosis     Tongue cancer (RAF-HCC)      07/06/2017 - 03/21/2018 Chemotherapy     OP HEAD/NECK CARBOPLATIN/PACLITAXEL (HARAF)  PACLitaxel 135 mg/m2, CARBOplatin AUC2 weekly      03/29/2018 -  Chemotherapy     OP HEAD/NECK PEMBROLIZUMAB  pembrolizumab 200 mg IV on day 1, every 21 days         Problem List:   Patient Active Problem List   Diagnosis   ??? Tongue cancer (CMS-HCC)   ??? AB (asthmatic bronchitis)   ??? Abdominal bruit   ??? Acute infection of nasal sinus   ??? Alopecia   ??? Arthritis   ??? BP (high blood pressure)   ??? Clinical depression   ??? Decreased body weight   ??? Encounter for counseling   ??? Encounter for immunization   ??? Fast heart beat   ??? Ganglion of hand   ??? Gravida 2 para 2   ??? Parity 2   ??? Renal bruit   ??? Community acquired pneumonia   ??? Hypertension   ??? DOE (dyspnea on exertion)   ??? Hypercalcemia of malignancy       Medications:  Current Outpatient Medications   Medication Sig Dispense Refill   ??? AEROCHAMBER PLUS FLOW-VU,M MSK Spcr use as directed with PROAIR INHALER if needed for wheezing  0   ??? capecitabine (XELODA) 500 MG tablet Take 3 tablets (1,500 mg total) by mouth Two (2) times a day . For 14 days on, 7 days off. 84 tablet 1   ??? gabapentin (NEURONTIN) 100 MG capsule Take 2 capsules (200 mg total) by mouth two (2) times a day. 270 capsule 3   ??? lactose-reduced food (ENSURE PLUS ORAL) Take by mouth.     ??? losartan (COZAAR) 25 MG tablet Take 25 mg by mouth.     ??? melatonin 10 mg Tab 10 mg by G-tube route nightly as needed (insomnia). 30 tablet 0   ??? ondansetron (ZOFRAN) 8 MG tablet Take 1 tablet (8 mg total) by mouth every eight (8) hours as needed for nausea (or vomiting). 30 tablet 2   ??? PROAIR HFA 90 mcg/actuation inhaler inhale 2 puffs by mouth every 4 to 6 hours if needed for wheezing  0   ??? prochlorperazine (COMPAZINE) 10 MG tablet Take 1 tablet (10 mg total) by mouth every six (6) hours as needed (Nausea/Vomiting). 30 tablet 2     No current facility-administered medications for this visit.        Allergies:  Allergies   Allergen Reactions   ??? Penicillins Hives, Itching and Rash     Tolerated ampicillin/sulbactam 03/31/2016.   ??? Rolapitant Hcl Other (See Comments)     Unsteady on feet, flushed and tachycardic.       Personal and Social History:   Social History  Tobacco Use   ??? Smoking status: Former Smoker     Packs/day: 1.00     Years: 35.00     Pack years: 35.00     Types: Cigarettes     Last attempt to quit: 03/05/2016     Years since quitting: 2.3   ??? Smokeless tobacco: Never Used   ??? Tobacco comment: Has neen referred to smoking cessation   Substance Use Topics   ??? Alcohol use: Yes     Comment: occ   She reports no history of drug use.    Family History:  Her family history includes Cancer in her sister; Heart failure in her father.    Review of Systems: A complete review of systems was obtained including: Constitutional, Eyes, ENT, Cardiovascular, Respiratory, GI, GU, Musculoskeletal, Skin, Neurological, Psychiatric, Endocrine, Heme/Lymphatic, and Allergic/Immunologic systems. All other systems reviewed and are negative to the patient???s management except for what was mentioned in the interim history.     Vital Signs: There were no vitals taken for this visit.    Laboratory Data:  No visits with results within 1 Day(s) from this visit.   Latest known visit with results is:   Office Visit on 06/28/2018   Component Date Value   ??? Magnesium 06/28/2018 1.9    ??? Sodium 06/28/2018 137    ??? Potassium 06/28/2018 4.0    ??? Chloride 06/28/2018 96*   ??? Anion Gap 06/28/2018 15    ??? CO2 06/28/2018 26.0    ??? BUN 06/28/2018 22*   ??? Creatinine 06/28/2018 0.70    ??? BUN/Creatinine Ratio 06/28/2018 31    ??? EGFR CKD-EPI Non-African* 06/28/2018 >90    ??? EGFR CKD-EPI African Ame* 06/28/2018 >90    ??? Glucose 06/28/2018 91    ??? Calcium 06/28/2018 12.8*   ??? Albumin 06/28/2018 4.0    ??? Total Protein 06/28/2018 7.7    ??? Total Bilirubin 06/28/2018 0.6    ??? AST 06/28/2018 31    ??? ALT 06/28/2018 12    ??? Alkaline Phosphatase 06/28/2018 223*   ??? WBC 06/28/2018 13.7*   ??? RBC 06/28/2018 3.16*   ??? HGB 06/28/2018 8.9*   ??? HCT 06/28/2018 28.9*   ??? MCV 06/28/2018 91.5    ??? [facility] 06/28/2018 28.2    ??? MCHC 06/28/2018 30.8*   ??? RDW 06/28/2018 14.3    ??? MPV 06/28/2018 7.9    ??? Platelet 06/28/2018 493*   ??? Neutrophils % 06/28/2018 82.5    ??? Lymphocytes % 06/28/2018 6.3    ??? Monocytes % 06/28/2018 7.2    ??? Eosinophils % 06/28/2018 1.4    ??? Basophils % 06/28/2018 0.4    ??? Neutrophil Left Shift 06/28/2018 1+*   ??? Absolute Neutrophils 06/28/2018 11.3*   ??? Absolute Lymphocytes 06/28/2018 0.9*   ??? Absolute Monocytes 06/28/2018 1.0*   ??? Absolute Eosinophils 06/28/2018 0.2    ??? Absolute Basophils 06/28/2018 0.1    ??? Large Unstained Cells 06/28/2018 2    ??? Hypochromasia 06/28/2018 Marked*   ??? Smear Review Comments 06/28/2018 See Comment*        I spent 15 minutes with Ms.Pascal in direct patient care.    Bobby Rumpf, PharmD, BCOP, CPP  Head & Neck Clinical Pharmacist Practitioner  Pager: [phone]

## 2018-08-02 MED FILL — CAPECITABINE 500 MG TABLET: ORAL | 21 days supply | Qty: 84 | Fill #1

## 2018-08-02 MED FILL — CAPECITABINE 500 MG TABLET: 21 days supply | Qty: 84 | Fill #1 | Status: AC

## 2018-08-02 NOTE — Unmapped (Signed)
[facility] Refill Coordination Note    Specialty Medication(s) to be Shipped:   Hematology/Oncology: capecitabine 500 mg       Paula Schmitt, DOB: 11/02/1955  Phone: [phone] (home)       All above HIPAA information was verified with patient.     Completed refill call assessment today to schedule patient's medication shipment from the [facility] [phone]).       Specialty medication(s) and dose(s) confirmed: Regimen is correct and unchanged.   Changes to medications: Paula Schmitt reports no changes reported at this time.  Changes to insurance: No  Questions for the pharmacist: No    Confirmed patient received Welcome Packet with first shipment. The patient will receive a drug information handout for each medication shipped and additional FDA Medication Guides as required.       DISEASE/MEDICATION-SPECIFIC INFORMATION        N/A    SPECIALTY MEDICATION ADHERENCE     Medication Adherence    Patient reported X missed doses in the last month:  0  Specialty Medication:  capecitabine 500 mg  Patient is on additional specialty medications:  No  Patient is on more than two specialty medications:  No  Any gaps in refill history greater than 2 weeks in the last 3 months:  no  Demonstrates understanding of importance of adherence:  no  Informant:  child/children  Reliability of informant:  reliable  Support network for adherence:  family member  Confirmed plan for next specialty medication refill:  delivery by pharmacy          Refill Coordination    Has the Patients' Contact Information Changed:  No  Is the Shipping Address Different:  No           capecitabine 500 mg  : 0 days of medicine on hand       SHIPPING     Shipping address confirmed in Epic.     Delivery Scheduled: Yes, Expected medication delivery date: 08/03/2018.     Medication will be delivered via Next Day Courier to the home address in Epic [place].    Jorje Guild   [facility] Specialty Technician

## 2018-08-02 NOTE — Unmapped (Signed)
I spoke with a family member. They confirmed medication list in chart is up-to-date for tomorrow's phone appointment.

## 2018-08-03 ENCOUNTER — Institutional Professional Consult (permissible substitution)
Admit: 2018-08-03 | Discharge: 2018-08-04 | Payer: MEDICARE | Attending: Hematology & Oncology | Primary: Hematology & Oncology

## 2018-08-03 DIAGNOSIS — I10 Essential (primary) hypertension: Secondary | ICD-10-CM

## 2018-08-03 DIAGNOSIS — C029 Malignant neoplasm of tongue, unspecified: Principal | ICD-10-CM

## 2018-08-03 DIAGNOSIS — R634 Abnormal weight loss: Secondary | ICD-10-CM

## 2018-08-03 LAB — CBC W/ DIFFERENTIAL
BANDED NEUTROPHILS ABSOLUTE COUNT: 0 10*3/uL (ref 0.0–0.1)
BASOPHILS ABSOLUTE COUNT: 0 10*3/uL (ref 0.0–0.2)
BASOPHILS RELATIVE PERCENT: 0 %
EOSINOPHILS ABSOLUTE COUNT: 0.4 10*3/uL (ref 0.0–0.4)
EOSINOPHILS RELATIVE PERCENT: 3 %
EOSINOPHILS RELATIVE PERCENT: 3 % — ABNORMAL HIGH (ref 1.4–7.0)
HEMATOCRIT: 26 % — ABNORMAL LOW (ref 34.0–46.6)
HEMOGLOBIN: 8.6 g/dL — ABNORMAL LOW (ref 11.1–15.9)
IMMATURE GRANULOCYTES: 0 %
LYMPHOCYTES ABSOLUTE COUNT: 1.1 10*3/uL (ref 0.7–3.1)
MEAN CORPUSCULAR HEMOGLOBIN: 29 pg (ref 26.6–33.0)
MEAN CORPUSCULAR VOLUME: 88 fL (ref 79–97)
MONOCYTES ABSOLUTE COUNT: 1.5 10*3/uL — ABNORMAL HIGH (ref 0.1–0.9)
MONOCYTES RELATIVE PERCENT: 14 %
NEUTROPHILS RELATIVE PERCENT: 73 %
PLATELET COUNT: 436 10*3/uL (ref 150–450)
RED BLOOD CELL COUNT: 2.97 x10E6/uL — ABNORMAL LOW (ref 3.77–5.28)
RED CELL DISTRIBUTION WIDTH: 14.7 % (ref 11.7–15.4)
WHITE BLOOD CELL COUNT: 10.7 10*3/uL (ref 3.4–10.8)

## 2018-08-03 LAB — COMPREHENSIVE METABOLIC PANEL
A/G RATIO: 1.3 (ref 1.2–2.2)
ALBUMIN: 3.9 g/dL (ref 3.8–4.8)
ALKALINE PHOSPHATASE: 216 IU/L — ABNORMAL HIGH (ref 39–117)
ALKALINE PHOSPHATASE: 216 IU/L — ABNORMAL HIGH (ref 39–117)
ALT (SGPT): 12 IU/L (ref 0–32)
AST (SGOT): 26 IU/L (ref 0–40)
BILIRUBIN TOTAL: 0.4 mg/dL (ref 0.0–1.2)
BLOOD UREA NITROGEN: 11 mg/dL (ref 8–27)
BUN / CREAT RATIO: 18 (ref 12–28)
CALCIUM: 11.4 mg/dL — ABNORMAL HIGH (ref 8.7–10.3)
CO2: 18 mmol/L — ABNORMAL LOW (ref 20–29)
CREATININE: 0.62 mg/dL (ref 0.57–1.00)
GFR MDRD AF AMER: 111 mL/min/{1.73_m2}
GFR MDRD NON AF AMER: 96 mL/min/{1.73_m2}
GLUCOSE: 106 mg/dL — ABNORMAL HIGH (ref 65–99)
POTASSIUM: 4.2 mmol/L (ref 3.5–5.2)
TOTAL PROTEIN: 6.8 g/dL (ref 6.0–8.5)

## 2018-08-03 NOTE — Unmapped (Signed)
+++++++++++++++++++++++++++++++++++++++++++++++++++++++++++    Your nurse navigator is:  Roseanne Reno, RN, BSN, OCN   Head & Neck/Sarcoma  Oncology Nurse Navigator  [email or web address]    For appointments & questions Monday through Friday 8 AM??? 5 PM   please call [phone] or Toll free [phone].    On Nights, Weekends and Holidays  Call [phone] and ask for the adult hematologist-oncologist on call.    [facility]  [address]  [place], [place] [address]  [email or web address]  Tel: [phone] / Toll Free [phone]  Fax: [phone]  +++++++++++++++++++++++++++++++++++++++++++++++++++++++++++

## 2018-08-03 NOTE — Unmapped (Signed)
HEAD AND NECK ONCOLOGY FOLLOW UP VISIT    Encounter Date: 08/03/2018 (Phone visit)  Patient Name: Paula Schmitt  Medical Record Number: [id number]    Referring Physician: Kennyth Arnold, [facility] otolaryngology     Primary Care Provider: Laurin Coder, FNP     +++++++++++++++++++++++++++++++++  I spent 21 minutes on the phone with the patient. I spent an additional 20 minutes on pre- and post-visit activities.     The patient was physically located in [place] or a state in which I am permitted to provide care. The patient understood that s/he may incur co-pays and cost sharing, and agreed to the telemedicine visit. The visit was completed via phone and/or video, which was appropriate and reasonable under the circumstances given the patient's presentation at the time.    The patient has been advised of the potential risks and limitations of this mode of treatment (including, but not limited to, the absence of in-person examination) and has agreed to be treated using telemedicine. The patient's/patient's family's questions regarding telemedicine have been answered.     If the phone/video visit was completed in an ambulatory setting, the patient has also been advised to contact their provider???s office for worsening conditions, and seek emergency medical treatment and/or call 911 if the patient deems either necessary.         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 Now with POD currently on Pembro. Right hip/pelvis pain has improved s/p gabapentin initiation. Denies other pain.  No dysphagia.  Reports feeling well, no increasing fatigue, muscle aches.       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   - Dose reduced cisplatin (100mg /m2 to 40mg /m2) starting day 21 due to increased mucositis and grade 2 radiation dermatitis in the setting of post-operative infection. Ultimately discontinued due to RT dermatitis (not systemic intolerance to cisplatin).  - PET 07/01/17 showed persistent increased FDG uptake in the anterior right aspect of the floor of the mouth worrisome for persistent disease, new LLL subpleural pulmonary nodule, increase in size and FDG uptake of hypoattenuating mass in the right hepatic lobe (biopsy proven), persistent focal increase in FDG uptake in the tip of the left scapula worrisome for potential osseous metastasis  - CT neck and chest 08/15/17 does not show enhancing lesion at the site of prior FDG uptake in the anterior aspect of the residual right tongue, subcentimeter bilateral pulmonary nodules and hepatic metastases are stable  - Discussed limitations of CT imaging for her H&N mass, will attempt to obtain insurance approval for PET intermittently to continue to follow this lesion, ideally with visualization by Dr. Azucena Fallen with ENT as well  -  Palliative modified Haraf with carboplatin and paclitaxel weekly x2 followed by 3rd week off, omit cetuximab due to elevated alpha gal.    -- Single agent pembrolizumab not indicated 1st line R/M given CPS score 1  - STRATA with MDM2 amplification, TP53 p.C176Y (limited sample prohibited ability to detect deep deletions)  - Further DR Taxol given ongoing intolerable grade 2 fatigue, resolved.  - PET 03/15/18 revealed new left mandibular lesion, mild increase of LLL nodule, liver lesion enlarged from 7.4cm to 10.4cm.   - PET 06/07/2018 with PD of liver lesion from  10.4cm to 13.4 cm.  Other sites mostly stable.    - Considering clinical trials, however Covid-19 issues prevented enrollment.    - proceed with C2 of Xeloda starting 08/04/18    Hypercalcemia  - 12.8 on 06/28/2018.  Xgeva on 06/29/2018.    - repeat xgeva next available this week      Weight Loss:  - Continued gradual weight loss noted at last visit. Son/patient will start weighing at home and recording.  Will call if continues to decline.      - Continue Marinol to 5mg  BID. Consider increase at next visit if continued anorexia. Reports appetite stable at present, states thinks Marinol has helped.     - Encouraged increased intake b/w breakfast and dinner. Patient will try to have at least 2-3 Boosts in the interval b/w dinner and breakfast.   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  Will increase to 200mg  at HS, 200mg  during the day if needed.    - Continue Gabapentin and Tylenol prn  - No focal pain with palpation.    - X-ray revealed no fracture, or other pelvic reason for pain.  Lumbar degenerative disc disease only which could cause referred pain to the hip. Discussed findings of x-ray with patient and son.    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  Weight down to 77lbs at last visit.  See above for recommendations.        Tobacco abuse: has quit    Tinnitus: Mild, chronic    Cancer Treatment Pain: None    Social: Good support - son who is a International aid/development worker present with patient via phone today.  He plans omn taking FMLA to help care for his mother.       Dispo:  Oral Xeloda for PD.    -F/U with labs and scans (PET since not visualized well with CT or MRI) then MD telehealth visit    HISTORY OF PRESENT ILLNESS:    Onc hx as detailed below. I personally verified the history and adjusted as necessary:  Oncology History    July 2017 - mouth pain initially thought to be gingivitis, eventually progressing to left otalgia and limitations in tongue mobility affecting speech/swallowing.  ??  02/01/16 ??? seen by Dr. Willeen Cass at [facility] with plans for biopsy of the tongue.   ??  02/01/16 ??? biopsy of the left oral tongue shows moderately differentiated SCC.  ??  02/07/16 ??? CT neck w/ contrast performed at [facility] shows a LEFT oral tongue lesion (2.8cm) extending to tip of tongue and to midline, involving anterior genioglossus muscle on left (and possibly right) and invasion into anterior floor of mouth with focal erosion of midline mandible.  Also showed necrotic lymph nodes bilaterally (1.3cm LEFT level 2 and 1.2cm LEFT level 1b and <1cm RIGHT level 1b).  ??  02/20/16 ??? seen by Dr.  Hackman at [facility] who notes firm LFET oral tongue, with tongue tethered to the anterior mandible and limited mobility and lesion along the anterior FOM extending down the left FOM posteriorly.  Left lateral oral tongue is similarly contiguously involved.  Lesion abuts anterior lingual cortex of the mandible.  ??  02/20/16 ??? CT chest is negative for metastatic disease.        Tongue cancer (CMS-HCC)    02/20/2016 Initial Diagnosis     Tongue cancer (RAF-HCC)      07/06/2017 - 03/21/2018 Chemotherapy     OP HEAD/NECK CARBOPLATIN/PACLITAXEL (HARAF)  PACLitaxel 135 mg/m2, CARBOplatin AUC2 weekly      03/29/2018 -  Chemotherapy     OP HEAD/NECK PEMBROLIZUMAB  pembrolizumab 200 mg IV on day 1, every 21 days       INTERVAL HISTORY: Today (A history was conducted today with the same results as the history from my last visit, with differences noted below):    Right hip pain is better with initiation of Tylenol and increase of Gabapentin. Using only Tylenol and gabapentin. Xray showed just DJD of L sp.  Mouth sores improved after stopping  Not interfering with food but brushing teeth.  No N/V  Brief episode of constipation couple weeks ago  No HFS Losing about 1lb per week despite eating well  Son [name] living with her more or less full time currently, agrees she is eating well.        REVIEW OF SYSTEMS:  A comprehensive review of 10 systems was negative except for pertinent positives noted in HPI.    Past Medical History:   Diagnosis Date   ??? Hypertension    ??? Oral cancer (CMS-HCC)       Past Surgical History:   Procedure Laterality Date   ??? IR INSERT G-TUBE PERCUTANEOUS  03/27/2016    IR INSERT G-TUBE PERCUTANEOUS 03/27/2016 Rush Barer, MD IMG VIR H&V [facility]   ??? PR BONE-SKIN GRAFT, MICROVASCULAR Left 03/31/2016    Procedure: Free Osteocutaneous Flap With Microvascular Anastomosis; Other Than Iliac Crest, Metatarsal Or Great Toe;  Surgeon: Noe Gens, MD;  Location: MAIN OR [facility];  Service: ENT   ??? PR EXCIS TONGUE,MOUTH,JAW Bilateral 03/31/2016    Procedure: GLOSSECTOMY; COMPOSITE WO RADICAL NECK DISSECT;  Surgeon: Lauralee Evener, MD;  Location: MAIN OR [facility];  Service: ENT   ??? PR EXPLORATION N/FLWD SURG NECK ARTERY Left 03/31/2016    Procedure: Exploration (Not Followed By Surgical Repair), With Or Without Lysis Or Artery; Carotid Artery;  Surgeon: Noe Gens, MD;  Location: MAIN OR [facility];  Service: ENT   ??? PR FREE MUSC-SKIN FLAP W/MICROVASC ANAST Left 03/31/2016    Procedure: Free Muscle Or Myocutaneous Flap With Microvascular Anastomosis;  Surgeon: Noe Gens, MD;  Location: MAIN OR [facility];  Service: ENT   ??? PR OPEN RX MANDIBLE FX Bilateral 03/31/2016    Procedure: Open Tx Mandib Fx; Wo Interdental Fixa;  Surgeon: Noe Gens, MD;  Location: [facility]IN OR [facility];  Service: ENT   ??? PR RECONSTR JAW,FULL-SUB IMPLNT Bilateral 03/31/2016    Procedure: RECON MANDIB/MAXIL SUBPERIOSTEAL IMPLNT; COMPL;  Surgeon: Lauralee Evener, MD;  Location: MAIN OR [place];  Service: ENT   ??? PR REMOVAL NODES, NECK,CERV MOD RAD Bilateral 03/31/2016    Procedure: CERVICAL LYMPHADENECTOMY (MODIFIED RADICAL NECK DISSECTION); Surgeon: Lauralee Evener, MD;  Location: [facility];  Service: ENT   ??? PR [facility] TUBE CHANGE Midline 03/31/2016    Procedure: Tracheotomy Tube  Change Befor Estab Fistula Trac;  Surgeon: Noe Gens, MD;  Location: MAIN OR [facility];  Service: ENT   ??? PR TRACHEOSTOMY, PLANNED Midline 03/31/2016    Procedure: TRACHEOSTOMY PLANNED (SEPART PROC);  Surgeon: Lauralee Evener, MD;  Location: MAIN OR [facility];  Service: ENT      Family History   Problem Relation Age of Onset   ??? Heart failure Father    ??? Cancer Sister         Thyroid   ??? Diabetes Neg Hx      Social History     Occupational History   ??? Not on file   Tobacco Use   ??? Smoking status: Former Smoker     Packs/day: 1.00     Years: 35.00     Pack years: 35.00     Types: Cigarettes     Last attempt to quit: 03/05/2016     Years since quitting: 2.4   ??? Smokeless tobacco: Never Used   ??? Tobacco comment: Has neen referred to smoking cessation   Substance and Sexual Activity   ??? Alcohol use: Yes     Comment: occ   ??? Drug use: No   ??? Sexual activity: Not on file   Lives alone in [place] area. Adult son lives in [place]. Son staying with her during CRT    Pregnancy status -- postmenapausal per pt    ALLERGIES/MEDICATIONS:  Reviewed in EPIC    PHYSICAL EXAM:     Speech fluent.  Otherwise no PE performed as visit is via phone.    RADIOLOGY:  Imaging was personally viewed as summarized in the HPI and detailed below.   CT Neck 08/15/17:  - Previously identified FDG uptake in the anterior aspect of the residual right tongue does not correlate with a definite enhancing lesion. Recommend correlation with direct examination.   - Uptake along the left maxilla could reflect dental disease as there are multiple dental caries and periapical lucencies in the maxillary molars at this location.  - High-grade proximal ICA stenosis bilaterally. See above.    CT Chest 08/15/17:  - Stable subcentimeter bilateral pulmonary nodules as described above. No new pulmonary nodules  - Similar-appearing hepatic metastases.    PET 07/01/17:  - Persistent abnormal increase in FDG uptake in the anterior right aspect of the floor the mouth. There is no definite CT correlation however this is worrisome for persistent disease. Recommend further evaluation with clinical exam.  -Interval development of a new hypermetabolic left lower lobe subpleural pulmonary nodule and an increase in the size and FDG uptake of the ??hypoattenuating mass in the right hepatic lobe. ??These ??findings are consistent with ??metastatic disease.  - Persistent focal increase in FDG uptake in the tip of the left scapula corresponding to an ill-defined lytic lesion. This is worrisome for potential osseous metastasis.    PET 11/30/17  -- Persistent mild increased FDG uptake in the anterior aspect of the floor of mouth which has decreased when compared to prior imaging. There is no definitive CT correlate. This may represent posttreatment changes however underlying persistent disease cannot be excluded.  --Interval decrease in the FDG uptake in the 0.6 cm FDG avid left lower lobe subpleural pulmonary nodule, when compared prior imaging. This is still consistent with metastatic disease.  --Redemonstration of the 7.4 cm hypoattenuating, FDG avid right hepatic lesion which now shows FDG uptake throughout the lesion and not just predominantly in the periphery when compared prior imaging. This may represent a  decrease in the central necrosis.  -- Redemonstrated minimal increased FDG uptake in the tip of the left scapula with no definitive CT correlate identified. Question responding osseous metastatic lesion.    PET 03/15/18  - Enlarging hypermetabolic liver lesion suggestive of worsening disease. 10.4 cm  - Enlarging FDG avid left lower lobe pulmonary nodule, worrisome for pulmonary metastasis. 5mm  - There appears to be a new FDG avid soft tissue focus superior to the left mandibular construction site, measuring about 0.9 cm on noncontrast CT. This could represent local recurrence. Clinical correlation and/or contrasted CT correlation is recommended.    PET 06/07/2018  IMPRESSION:  -Increasing size of centrally necrotic right hepatic lobe lesion now measuring up to 13.2 cm.??  -New porta hepatic and portacaval nodes consistent with metastatic disease??  -Increasing size of FDG avid left lower lobe pulmonary nodule consistent with metastatic disease.??  -FDG avid focus in the right mandibular reconstruction without CT correlate. This could represent a focus of infection versus local recurrence. Correlation with physical exam and attention on follow-up.  ??    PATHOLOGY:   Pathology personally reviewed. See epic for details, in brief:   Floor of mouth, mandible, left oral tongue and right hemioral tongue, composite resection   - Invasive squamous cell carcinoma, poorly differentiated, focally keratinizing, 2.6 cm, with associated carcinoma in situ, see synoptic summary  - Invasive carcinoma invades through the anterior mandible involving anterior soft tissue and is located 0.3 cm from the anterior soft tissue resection margin  - Invasive carcinoma involves anterior floor of mouth and ventral tongue with extension into tongue skeletal muscle, approaching the dorsal tongue epithelial surface  - Extensive perineural invasion is identified  - Lymphovascular invasion is identified  - Mandibular and specimen resection margins are negative for high grade dysplasia and invasive carcinoma  9/104 Lymph nodes positive for cancer with +ECE    LABS:  Reviewed in epic

## 2018-08-04 ENCOUNTER — Institutional Professional Consult (permissible substitution): Admit: 2018-08-04 | Discharge: 2018-08-05 | Payer: MEDICARE

## 2018-08-04 NOTE — Unmapped (Signed)
Labs completed on Monday, 08/02/18.     Pt received Xgeva injection in left upper arm - pt tolerated well.    AVS received at scheduling.    Pt stable and ambulated independently from clinic.

## 2018-08-19 NOTE — Unmapped (Signed)
Per appt note to convert MD appt to non epic video. Left messages on both patient and patient phones.

## 2018-08-20 NOTE — Unmapped (Signed)
COVID-19 Screening Questions (to be completed on all patients that will come to [facility] for any of their appointments):    - Have you traveled outside of the state in the last two weeks?  No  - Have you come in close contact with any person(s) with confirmed COVID-19? No  - In the last two weeks have you had a fever greater than 100.5F?  No  - Do you have any new or worsening respiratory symptoms as far as: cough, SOB, sore throat or upper respiratory nasal congestion?  No  - Do you have any new or worsening loss of smell or taste, not related to chemo? No  - Do you have new onset of vomiting or diarrhea not related to another medical condition or chemotherapy? No    *If the patient answers yes to any of the above: Message sent to the Oncology Nurse Triage for further assessment of symptoms. Explained to pt that they will receive a follow-up call form a RN to further assess their symptoms.     *If the patient answers no to all of the above, proceed with screening:      Pt acknowledges visit type: Lab and denies questions regarding visit.     Medication Reconciliation Completed? No    Oncology visitor restrictions: Patient informed that they are permitted to bring a visitor to their appointment, however if they feel as if they are able to attend the appointment safely by alone, they are encouraged to do so. Patient informed that if they do bring a visitor, it must be someone over the age of 87 and they will both be screened at the door prior to entering and will be given a mask if they are not wearing one.     BMT Clinic Visitor Policy: BMT patients are encouraged to continue bringing their 1 visitor over the age of 80 to their appointments to provide support. Pt is advised that the patient and visitor will both be screened upon arrival and will be given a mask if they are not already wearing one.

## 2018-08-20 NOTE — Unmapped (Signed)
Video Visit  Patient approved appointment change: yes  Patient confirmed streaming device: yes  Patient has mychart: yes  Patient demographics and insurance updated: yes  MSPQ complete: N/A  Instructions provided for video visit: yes

## 2018-08-23 ENCOUNTER — Other Ambulatory Visit: Admit: 2018-08-23 | Discharge: 2018-08-23 | Payer: MEDICARE

## 2018-08-23 ENCOUNTER — Ambulatory Visit: Admit: 2018-08-23 | Discharge: 2018-08-23 | Payer: MEDICARE

## 2018-08-23 DIAGNOSIS — C029 Malignant neoplasm of tongue, unspecified: Principal | ICD-10-CM

## 2018-08-23 DIAGNOSIS — Z79899 Other long term (current) drug therapy: Secondary | ICD-10-CM

## 2018-08-23 LAB — COMPREHENSIVE METABOLIC PANEL
ALBUMIN: 4.1 g/dL (ref 3.5–5.0)
ALKALINE PHOSPHATASE: 208 U/L — ABNORMAL HIGH (ref 38–126)
ALT (SGPT): 12 U/L (ref <35)
ANION GAP: 12 mmol/L (ref 7–15)
AST (SGOT): 35 U/L (ref 14–38)
BILIRUBIN TOTAL: 0.7 mg/dL (ref 0.0–1.2)
BLOOD UREA NITROGEN: 15 mg/dL (ref 7–21)
CALCIUM: 11.3 mg/dL — ABNORMAL HIGH (ref 8.5–10.2)
CHLORIDE: 101 mmol/L (ref 98–107)
CO2: 23 mmol/L (ref 22.0–30.0)
CREATININE: 0.55 mg/dL — ABNORMAL LOW (ref 0.60–1.00)
EGFR CKD-EPI AA FEMALE: >90 mL/min/{1.73_m2} (ref >=60)
EGFR CKD-EPI NON-AA FEMALE: >90 mL/min/{1.73_m2} (ref >=60)
GLUCOSE RANDOM: 103 mg/dL (ref 70–179)
PROTEIN TOTAL: 7.7 g/dL (ref 6.5–8.3)
SODIUM: 136 mmol/L (ref 135–145)

## 2018-08-23 LAB — SMEAR REVIEW

## 2018-08-23 LAB — CBC W/ AUTO DIFF
BASOPHILS ABSOLUTE COUNT: 0 10*9/L (ref 0.0–0.1)
BASOPHILS RELATIVE PERCENT: 0.4 %
EOSINOPHILS ABSOLUTE COUNT: 0.2 10*9/L (ref 0.0–0.4)
EOSINOPHILS RELATIVE PERCENT: 2 %
HEMATOCRIT: 30.6 % — ABNORMAL LOW (ref 36.0–46.0)
HEMOGLOBIN: 9.5 g/dL — ABNORMAL LOW (ref 12.0–16.0)
LYMPHOCYTES ABSOLUTE COUNT: 0.9 10*9/L — ABNORMAL LOW (ref 1.5–5.0)
LYMPHOCYTES RELATIVE PERCENT: 8 %
MEAN CORPUSCULAR HEMOGLOBIN CONC: 31 g/dL (ref 31.0–37.0)
MEAN CORPUSCULAR HEMOGLOBIN: 31 pg (ref 26.0–34.0)
MEAN PLATELET VOLUME: 7.1 fL (ref 7.0–10.0)
MONOCYTES ABSOLUTE COUNT: 1.4 10*9/L — ABNORMAL HIGH (ref 0.2–0.8)
MONOCYTES RELATIVE PERCENT: 12.2 %
NEUTROPHILS ABSOLUTE COUNT: 8.3 10*9/L — ABNORMAL HIGH (ref 2.0–7.5)
NEUTROPHILS RELATIVE PERCENT: 75.3 %
PLATELET COUNT: 544 10*9/L — ABNORMAL HIGH (ref 150–440)
RED BLOOD CELL COUNT: 3.06 10*12/L — ABNORMAL LOW (ref 4.00–5.20)
RED CELL DISTRIBUTION WIDTH: 25.9 % — ABNORMAL HIGH (ref 12.0–15.0)
WBC ADJUSTED: 11.1 10*9/L — ABNORMAL HIGH (ref 4.5–11.0)

## 2018-08-23 LAB — THYROID STIMULATING HORMONE: Thyrotropin:ACnc:Pt:Ser/Plas:Qn:: 2.29

## 2018-08-23 LAB — CHLORIDE: Chloride:SCnc:Pt:Ser/Plas:Qn:: 101

## 2018-08-23 LAB — RED CELL DISTRIBUTION WIDTH: Lab: 25.9 — ABNORMAL HIGH

## 2018-08-23 LAB — FREE T4: Thyroxine.free:MCnc:Pt:Ser/Plas:Qn:: 1.56 — ABNORMAL HIGH

## 2018-08-23 NOTE — Unmapped (Signed)
#  22 PIV placed @ [facility].  Labs collected. To next appt.

## 2018-08-23 NOTE — Unmapped (Signed)
Video visit confirmed

## 2018-08-24 ENCOUNTER — Telehealth
Admit: 2018-08-24 | Discharge: 2018-08-25 | Payer: MEDICARE | Attending: Hematology & Oncology | Primary: Hematology & Oncology

## 2018-08-24 DIAGNOSIS — Z79899 Other long term (current) drug therapy: Secondary | ICD-10-CM

## 2018-08-24 DIAGNOSIS — I10 Essential (primary) hypertension: Secondary | ICD-10-CM

## 2018-08-24 DIAGNOSIS — C029 Malignant neoplasm of tongue, unspecified: Principal | ICD-10-CM

## 2018-08-24 DIAGNOSIS — J4520 Mild intermittent asthma, uncomplicated: Secondary | ICD-10-CM

## 2018-08-24 DIAGNOSIS — J189 Pneumonia, unspecified organism: Secondary | ICD-10-CM

## 2018-08-24 DIAGNOSIS — J0100 Acute maxillary sinusitis, unspecified: Secondary | ICD-10-CM

## 2018-08-24 MED ORDER — OXYCODONE 5 MG TABLET
ORAL_TABLET | 0 refills | 0 days | Status: CP
Start: 2018-08-24 — End: 2018-09-14

## 2018-08-24 MED ORDER — GABAPENTIN 300 MG CAPSULE
ORAL_CAPSULE | 5 refills | 0 days | Status: CP
Start: 2018-08-24 — End: ?

## 2018-08-24 NOTE — Unmapped (Signed)
We discussed either cetuximab or methotrexate starting next week.    Plan for infusion next week at [facility] main [facility] for the first treatment.    ; ???Support for cancer patients and their caregivers during the COVID-19 pandemic [email or web address]  ; Keeping cancer patients safe during the COVID-19 crisis [email or web address]-crisis/  +++++++++++++++++++++++++++++++++++++++++++++++++++++++++++  Your nurse navigator is:  Roseanne Reno, RN, BSN, OCN   Head & Neck/Sarcoma  Oncology Nurse Navigator  [email or web address]    For appointments & questions Monday through Friday 8 AM??? 5 PM   please call [phone] or Toll free [phone].    On Nights, Weekends and Holidays  Call [phone] and ask for the adult hematologist-oncologist on call.    [facility]  [address]  [place], [place] [address]  [email or web address]  Tel: [phone] / Toll Free [phone]  Fax: [phone]  +++++++++++++++++++++++++++++++++++++++++++++++++++++++++++

## 2018-08-24 NOTE — Unmapped (Signed)
Patient disenrolled from [facility] specialty program as she is no longer on treatment with Xeloda.    Bobby Rumpf, PharmD, BCOP, CPP  Head & Neck Clinical Pharmacist Practitioner  Pager: [phone]

## 2018-09-01 ENCOUNTER — Ambulatory Visit: Admit: 2018-09-01 | Discharge: 2018-09-02 | Payer: MEDICARE

## 2018-09-01 DIAGNOSIS — C029 Malignant neoplasm of tongue, unspecified: Principal | ICD-10-CM

## 2018-09-07 ENCOUNTER — Ambulatory Visit: Admit: 2018-09-07 | Discharge: 2018-09-07 | Payer: MEDICARE

## 2018-09-07 DIAGNOSIS — C029 Malignant neoplasm of tongue, unspecified: Principal | ICD-10-CM

## 2018-09-14 ENCOUNTER — Ambulatory Visit: Admit: 2018-09-14 | Discharge: 2018-09-14 | Payer: MEDICARE

## 2018-09-14 ENCOUNTER — Institutional Professional Consult (permissible substitution): Admit: 2018-09-14 | Discharge: 2018-09-14 | Payer: MEDICARE | Attending: Oncology | Primary: Oncology

## 2018-09-14 DIAGNOSIS — C029 Malignant neoplasm of tongue, unspecified: Principal | ICD-10-CM

## 2018-09-14 DIAGNOSIS — G893 Neoplasm related pain (acute) (chronic): Secondary | ICD-10-CM

## 2018-09-14 MED ORDER — OXYCODONE 5 MG TABLET
ORAL_TABLET | 0 refills | 0 days | Status: CP
Start: 2018-09-14 — End: 2018-10-12

## 2018-09-21 ENCOUNTER — Telehealth
Admit: 2018-09-21 | Discharge: 2018-09-21 | Payer: MEDICARE | Attending: Hematology & Oncology | Primary: Hematology & Oncology

## 2018-09-21 ENCOUNTER — Ambulatory Visit: Admit: 2018-09-21 | Discharge: 2018-09-21 | Payer: MEDICARE

## 2018-09-21 DIAGNOSIS — I10 Essential (primary) hypertension: Secondary | ICD-10-CM

## 2018-09-21 DIAGNOSIS — Z79899 Other long term (current) drug therapy: Secondary | ICD-10-CM

## 2018-09-21 DIAGNOSIS — J189 Pneumonia, unspecified organism: Secondary | ICD-10-CM

## 2018-09-21 DIAGNOSIS — J0100 Acute maxillary sinusitis, unspecified: Secondary | ICD-10-CM

## 2018-09-21 DIAGNOSIS — C029 Malignant neoplasm of tongue, unspecified: Principal | ICD-10-CM

## 2018-09-21 DIAGNOSIS — R0609 Other forms of dyspnea: Secondary | ICD-10-CM

## 2018-09-27 ENCOUNTER — Ambulatory Visit: Admit: 2018-09-27 | Discharge: 2018-09-28 | Payer: MEDICARE

## 2018-09-27 DIAGNOSIS — C029 Malignant neoplasm of tongue, unspecified: Principal | ICD-10-CM

## 2018-10-04 ENCOUNTER — Ambulatory Visit: Admit: 2018-10-04 | Discharge: 2018-10-05 | Payer: MEDICARE

## 2018-10-04 DIAGNOSIS — C029 Malignant neoplasm of tongue, unspecified: Principal | ICD-10-CM

## 2018-10-04 MED ORDER — DOXYCYCLINE MONOHYDRATE 100 MG CAPSULE
ORAL_CAPSULE | Freq: Two times a day (BID) | ORAL | 2 refills | 0 days | Status: CP
Start: 2018-10-04 — End: 2019-01-02

## 2018-10-11 ENCOUNTER — Ambulatory Visit: Admit: 2018-10-11 | Discharge: 2018-10-12 | Payer: MEDICARE

## 2018-10-11 DIAGNOSIS — C029 Malignant neoplasm of tongue, unspecified: Principal | ICD-10-CM

## 2018-10-13 MED ORDER — OXYCODONE 5 MG TABLET
ORAL_TABLET | Freq: Four times a day (QID) | ORAL | 0 refills | 0.00000 days | Status: CP | PRN
Start: 2018-10-13 — End: 2018-11-01

## 2018-10-18 ENCOUNTER — Ambulatory Visit: Admit: 2018-10-18 | Discharge: 2018-10-18 | Payer: MEDICARE

## 2018-10-18 ENCOUNTER — Telehealth
Admit: 2018-10-18 | Discharge: 2018-10-18 | Payer: MEDICARE | Attending: Hematology & Oncology | Primary: Hematology & Oncology

## 2018-10-18 DIAGNOSIS — C029 Malignant neoplasm of tongue, unspecified: Principal | ICD-10-CM

## 2018-10-18 DIAGNOSIS — C040 Malignant neoplasm of anterior floor of mouth: Secondary | ICD-10-CM

## 2018-10-18 DIAGNOSIS — Z79899 Other long term (current) drug therapy: Secondary | ICD-10-CM

## 2018-10-18 DIAGNOSIS — C787 Secondary malignant neoplasm of liver and intrahepatic bile duct: Secondary | ICD-10-CM

## 2018-10-18 DIAGNOSIS — I10 Essential (primary) hypertension: Secondary | ICD-10-CM

## 2018-10-25 ENCOUNTER — Ambulatory Visit: Admit: 2018-10-25 | Discharge: 2018-10-26 | Payer: MEDICARE

## 2018-10-25 DIAGNOSIS — C029 Malignant neoplasm of tongue, unspecified: Principal | ICD-10-CM

## 2018-11-02 MED ORDER — OXYCODONE 5 MG TABLET
ORAL_TABLET | Freq: Four times a day (QID) | ORAL | 0 refills | 15 days | Status: CP | PRN
Start: 2018-11-02 — End: ?

## 2018-11-08 ENCOUNTER — Telehealth
Admit: 2018-11-08 | Discharge: 2018-11-09 | Payer: MEDICARE | Attending: Hematology & Oncology | Primary: Hematology & Oncology

## 2018-11-08 ENCOUNTER — Ambulatory Visit: Admit: 2018-11-08 | Discharge: 2018-11-09 | Payer: MEDICARE

## 2018-11-08 DIAGNOSIS — C029 Malignant neoplasm of tongue, unspecified: Principal | ICD-10-CM

## 2018-11-08 DIAGNOSIS — Z79899 Other long term (current) drug therapy: Secondary | ICD-10-CM

## 2018-11-08 MED ORDER — MORPHINE ER 15 MG TABLET,EXTENDED RELEASE
ORAL_TABLET | Freq: Two times a day (BID) | ORAL | 0 refills | 30.00000 days | Status: CP
Start: 2018-11-08 — End: ?

## 2018-11-20 DEATH — deceased

## 2018-12-27 DIAGNOSIS — C029 Malignant neoplasm of tongue, unspecified: Secondary | ICD-10-CM

## 2019-01-03 DIAGNOSIS — C029 Malignant neoplasm of tongue, unspecified: Secondary | ICD-10-CM

## 2019-01-10 DIAGNOSIS — C029 Malignant neoplasm of tongue, unspecified: Secondary | ICD-10-CM

## 2019-01-17 DIAGNOSIS — C029 Malignant neoplasm of tongue, unspecified: Secondary | ICD-10-CM

## 2019-01-24 DIAGNOSIS — C029 Malignant neoplasm of tongue, unspecified: Secondary | ICD-10-CM

## 2019-01-31 DIAGNOSIS — C029 Malignant neoplasm of tongue, unspecified: Principal | ICD-10-CM

## 2019-02-07 DIAGNOSIS — C029 Malignant neoplasm of tongue, unspecified: Principal | ICD-10-CM

## 2019-02-14 DIAGNOSIS — C029 Malignant neoplasm of tongue, unspecified: Principal | ICD-10-CM

## 2019-02-21 DIAGNOSIS — C029 Malignant neoplasm of tongue, unspecified: Principal | ICD-10-CM

## 2019-02-28 DIAGNOSIS — C029 Malignant neoplasm of tongue, unspecified: Principal | ICD-10-CM

## 2019-03-07 DIAGNOSIS — C029 Malignant neoplasm of tongue, unspecified: Principal | ICD-10-CM

## 2019-03-14 DIAGNOSIS — C029 Malignant neoplasm of tongue, unspecified: Principal | ICD-10-CM

## 2019-03-28 DIAGNOSIS — C029 Malignant neoplasm of tongue, unspecified: Principal | ICD-10-CM

## 2019-04-04 DIAGNOSIS — C029 Malignant neoplasm of tongue, unspecified: Principal | ICD-10-CM

## 2019-04-11 DIAGNOSIS — C029 Malignant neoplasm of tongue, unspecified: Principal | ICD-10-CM

## 2019-04-18 DIAGNOSIS — C029 Malignant neoplasm of tongue, unspecified: Principal | ICD-10-CM

## 2019-04-25 DIAGNOSIS — C029 Malignant neoplasm of tongue, unspecified: Principal | ICD-10-CM

## 2019-05-02 DIAGNOSIS — C029 Malignant neoplasm of tongue, unspecified: Principal | ICD-10-CM

## 2019-05-09 DIAGNOSIS — C029 Malignant neoplasm of tongue, unspecified: Principal | ICD-10-CM

## 2019-05-16 DIAGNOSIS — C029 Malignant neoplasm of tongue, unspecified: Principal | ICD-10-CM

## 2019-05-23 DIAGNOSIS — C029 Malignant neoplasm of tongue, unspecified: Principal | ICD-10-CM

## 2019-05-30 DIAGNOSIS — C029 Malignant neoplasm of tongue, unspecified: Principal | ICD-10-CM

## 2019-06-06 DIAGNOSIS — C029 Malignant neoplasm of tongue, unspecified: Principal | ICD-10-CM

## 2019-06-13 DIAGNOSIS — C029 Malignant neoplasm of tongue, unspecified: Principal | ICD-10-CM

## 2019-06-20 DIAGNOSIS — C029 Malignant neoplasm of tongue, unspecified: Principal | ICD-10-CM

## 2019-06-27 DIAGNOSIS — C029 Malignant neoplasm of tongue, unspecified: Principal | ICD-10-CM

## 2019-07-04 DIAGNOSIS — C029 Malignant neoplasm of tongue, unspecified: Principal | ICD-10-CM

## 2019-07-11 DIAGNOSIS — C029 Malignant neoplasm of tongue, unspecified: Principal | ICD-10-CM

## 2019-07-18 DIAGNOSIS — C029 Malignant neoplasm of tongue, unspecified: Principal | ICD-10-CM

## 2019-07-25 DIAGNOSIS — C029 Malignant neoplasm of tongue, unspecified: Principal | ICD-10-CM

## 2019-08-01 DIAGNOSIS — C029 Malignant neoplasm of tongue, unspecified: Principal | ICD-10-CM

## 2019-08-08 DIAGNOSIS — C029 Malignant neoplasm of tongue, unspecified: Principal | ICD-10-CM

## 2019-08-15 DIAGNOSIS — C029 Malignant neoplasm of tongue, unspecified: Principal | ICD-10-CM

## 2019-08-22 DIAGNOSIS — C029 Malignant neoplasm of tongue, unspecified: Principal | ICD-10-CM

## 2019-08-29 DIAGNOSIS — C029 Malignant neoplasm of tongue, unspecified: Principal | ICD-10-CM

## 2019-09-05 DIAGNOSIS — C029 Malignant neoplasm of tongue, unspecified: Principal | ICD-10-CM

## 2019-09-12 DIAGNOSIS — C029 Malignant neoplasm of tongue, unspecified: Principal | ICD-10-CM
